# Patient Record
Sex: Female | Born: 1955 | Race: White | Hispanic: No | Marital: Married | Smoking: Never smoker
Health system: Southern US, Community
[De-identification: ages and names within clinical notes are randomized; demographics above are authoritative.]

## PROBLEM LIST (undated history)

## (undated) DIAGNOSIS — F514 Sleep terrors [night terrors]: Secondary | ICD-10-CM

## (undated) DIAGNOSIS — I10 Essential (primary) hypertension: Secondary | ICD-10-CM

## (undated) HISTORY — DX: Sleep terrors (night terrors): F51.4

## (undated) HISTORY — PX: HEMORRHOID SURGERY: SHX153

## (undated) HISTORY — PX: DILATION AND CURETTAGE OF UTERUS: SHX78

---

## 2018-09-15 DIAGNOSIS — U071 COVID-19: Secondary | ICD-10-CM | POA: Insufficient documentation

## 2018-09-15 HISTORY — DX: COVID-19: U07.1

## 2018-12-02 ENCOUNTER — Telehealth: Payer: Self-pay | Admitting: Emergency Medicine

## 2018-12-13 LAB — NOVEL CORONAVIRUS, NAA: SARS-CoV-2, NAA: NOT DETECTED

## 2019-11-18 LAB — COMPREHENSIVE METABOLIC PANEL
Albumin: 4.4 (ref 3.5–5.0)
Calcium: 9.5 (ref 8.7–10.7)

## 2019-11-18 LAB — BASIC METABOLIC PANEL
BUN: 16 (ref 4–21)
CO2: 28 — AB (ref 13–22)
Chloride: 101 (ref 99–108)
Creatinine: 0.8 (ref 0.5–1.1)
Glucose: 94
Potassium: 4.3 (ref 3.4–5.3)
Sodium: 140 (ref 137–147)

## 2019-11-18 LAB — HEPATIC FUNCTION PANEL
ALT: 10 (ref 7–35)
AST: 15 (ref 13–35)
Alkaline Phosphatase: 73 (ref 25–125)
Bilirubin, Total: 0.3

## 2019-11-18 LAB — TSH: TSH: 1.25 (ref 0.41–5.90)

## 2019-11-18 LAB — CBC AND DIFFERENTIAL
HCT: 38 (ref 36–46)
Hemoglobin: 12.9 (ref 12.0–16.0)
Platelets: 247 (ref 150–399)
WBC: 6.4

## 2019-11-18 LAB — LIPID PANEL
Cholesterol: 192 (ref 0–200)
HDL: 54 (ref 35–70)
LDL Cholesterol: 119
LDl/HDL Ratio: 2.2
Triglycerides: 104 (ref 40–160)

## 2019-12-26 ENCOUNTER — Ambulatory Visit (INDEPENDENT_AMBULATORY_CARE_PROVIDER_SITE_OTHER): Payer: Self-pay | Admitting: Gastroenterology

## 2019-12-26 ENCOUNTER — Other Ambulatory Visit: Payer: Self-pay

## 2019-12-26 DIAGNOSIS — Z1211 Encounter for screening for malignant neoplasm of colon: Secondary | ICD-10-CM

## 2019-12-26 MED ORDER — NA SULFATE-K SULFATE-MG SULF 17.5-3.13-1.6 GM/177ML PO SOLN: 1.0000 | Freq: Once | ORAL | 0 refills | Status: AC

## 2019-12-26 NOTE — Progress Notes (Signed)
Gastroenterology Pre-Procedure Review  Request Date: Friday 01/27/20 Requesting Physician: Dr. Tobi Bastos  PATIENT REVIEW QUESTIONS: The patient's daughter Karie Mainland responded to the following health history questions as indicated:    1. Are you having any GI issues? yes (stomach pain after eating) 2. Do you have a personal history of Polyps? no 3. Do you have a family history of Colon Cancer or Polyps? no 4. Diabetes Mellitus? no 5. Joint replacements in the past 12 months?no 6. Major health problems in the past 3 months?no 7. Any artificial heart valves, MVP, or defibrillator?no    MEDICATIONS & ALLERGIES:    Patient reports the following regarding taking any anticoagulation/antiplatelet therapy:   Plavix, Coumadin, Eliquis, Xarelto, Lovenox, Pradaxa, Brilinta, or Effient? no Aspirin? no  Patient confirms/reports the following medications:  No current outpatient medications on file.   No current facility-administered medications for this visit.    Patient confirms/reports the following allergies:  Not on File  No orders of the defined types were placed in this encounter.   AUTHORIZATION INFORMATION Primary Insurance: 1D#: Group #:  Secondary Insurance: 1D#: Group #:  SCHEDULE INFORMATION: Date: 01/27/20 Time: Location:[facility]

## 2019-12-27 ENCOUNTER — Encounter: Payer: Self-pay | Admitting: Gastroenterology

## 2019-12-28 ENCOUNTER — Other Ambulatory Visit (HOSPITAL_COMMUNITY): Payer: 59

## 2019-12-29 ENCOUNTER — Other Ambulatory Visit: Payer: Self-pay

## 2020-01-11 ENCOUNTER — Telehealth: Payer: Self-pay

## 2020-01-12 NOTE — Telephone Encounter (Signed)
PCP called 01/11/20 to speak to Dr. Tobi Bastos. Confirmed correct ph# & advised Dr. Tobi Bastos will call shortly.

## 2020-01-13 ENCOUNTER — Other Ambulatory Visit (HOSPITAL_COMMUNITY)
Admission: RE | Admit: 2020-01-13 | Discharge: 2020-01-13 | Disposition: A | Discharge status: Home / Self Care | Payer: 59 | Source: Ambulatory Visit | Attending: Gastroenterology | Admitting: Gastroenterology

## 2020-01-13 DIAGNOSIS — Z01812 Encounter for preprocedural laboratory examination: Secondary | ICD-10-CM | POA: Diagnosis present

## 2020-01-13 DIAGNOSIS — U071 COVID-19: Secondary | ICD-10-CM | POA: Insufficient documentation

## 2020-01-13 LAB — SARS CORONAVIRUS 2 (TAT 6-24 HRS): SARS Coronavirus 2: POSITIVE — AB

## 2020-01-14 NOTE — Progress Notes (Signed)
Merrily Pew from the on-call service of Dr. Wyline Mood with + covid results for this pt from Fri 4/30. The pt is scheduled for a procedure on Tues 5/4 at [facility].These are the current guidelines:  Positive Results for:  Asymptomatic: Procedure postponed and quarantine for 10 days. Symptomatic: Procedure postponed and quarantine for 14 days. Immunocompromised: Procedure postponed and quarantine for 20 days.  The pt will not be retested for 90 days from the + result.

## 2020-01-15 ENCOUNTER — Encounter: Payer: Self-pay | Admitting: Gastroenterology

## 2020-01-15 NOTE — Progress Notes (Signed)
Amber Nichols/Amber Nichols - inform patient she is positive for COVID- reschedule procedures.   FYI- Dr Dario Guardian    Dr Wyline Mood MD,MRCP [facility]) Gastroenterology/Hepatology Pager: [phone]

## 2020-01-16 ENCOUNTER — Other Ambulatory Visit: Payer: Self-pay

## 2020-01-16 DIAGNOSIS — R0789 Other chest pain: Secondary | ICD-10-CM

## 2020-01-16 DIAGNOSIS — Z1211 Encounter for screening for malignant neoplasm of colon: Secondary | ICD-10-CM

## 2020-01-17 ENCOUNTER — Ambulatory Visit: Admission: RE | Admit: 2020-01-17 | Payer: 59 | Source: Home / Self Care | Admitting: Gastroenterology

## 2020-01-17 ENCOUNTER — Encounter: Admission: RE | Payer: Self-pay | Source: Home / Self Care

## 2020-01-17 SURGERY — COLONOSCOPY WITH PROPOFOL
Anesthesia: General

## 2020-01-25 ENCOUNTER — Other Ambulatory Visit (HOSPITAL_COMMUNITY): Payer: 59

## 2020-02-08 ENCOUNTER — Other Ambulatory Visit: Admission: RE | Admit: 2020-02-08 | Payer: 59 | Source: Ambulatory Visit

## 2020-02-10 ENCOUNTER — Ambulatory Visit: Payer: 59 | Admitting: Anesthesiology

## 2020-02-10 ENCOUNTER — Ambulatory Visit
Admission: RE | Admit: 2020-02-10 | Discharge: 2020-02-10 | Disposition: A | Discharge status: Home / Self Care | Payer: 59 | Attending: Gastroenterology | Admitting: Gastroenterology

## 2020-02-10 ENCOUNTER — Encounter: Payer: Self-pay | Admitting: Gastroenterology

## 2020-02-10 ENCOUNTER — Encounter
Admission: RE | Disposition: A | Discharge status: Home / Self Care | Payer: Self-pay | Source: Home / Self Care | Attending: Gastroenterology

## 2020-02-10 DIAGNOSIS — Z79899 Other long term (current) drug therapy: Secondary | ICD-10-CM | POA: Diagnosis not present

## 2020-02-10 DIAGNOSIS — R1013 Epigastric pain: Secondary | ICD-10-CM

## 2020-02-10 DIAGNOSIS — R0789 Other chest pain: Secondary | ICD-10-CM

## 2020-02-10 DIAGNOSIS — K2950 Unspecified chronic gastritis without bleeding: Secondary | ICD-10-CM | POA: Insufficient documentation

## 2020-02-10 DIAGNOSIS — J45909 Unspecified asthma, uncomplicated: Secondary | ICD-10-CM | POA: Insufficient documentation

## 2020-02-10 DIAGNOSIS — I10 Essential (primary) hypertension: Secondary | ICD-10-CM | POA: Insufficient documentation

## 2020-02-10 DIAGNOSIS — K317 Polyp of stomach and duodenum: Secondary | ICD-10-CM | POA: Insufficient documentation

## 2020-02-10 DIAGNOSIS — Z1211 Encounter for screening for malignant neoplasm of colon: Secondary | ICD-10-CM

## 2020-02-10 HISTORY — DX: Essential (primary) hypertension: I10

## 2020-02-10 HISTORY — PX: ESOPHAGOGASTRODUODENOSCOPY (EGD) WITH PROPOFOL: SHX5813

## 2020-02-10 HISTORY — PX: COLONOSCOPY WITH PROPOFOL: SHX5780

## 2020-02-10 SURGERY — COLONOSCOPY WITH PROPOFOL
Anesthesia: General

## 2020-02-10 MED ORDER — PROPOFOL 500 MG/50ML IV EMUL
INTRAVENOUS | Status: DC | PRN
  Administered 2020-02-10: 40 mg via INTRAVENOUS
  Administered 2020-02-10: 80 ug/kg/min via INTRAVENOUS
  Administered 2020-02-10: 40 mg via INTRAVENOUS
  Administered 2020-02-10: 20 mg via INTRAVENOUS

## 2020-02-10 MED ORDER — SODIUM CHLORIDE 0.9 % IV SOLN
INTRAVENOUS | Status: DC
  Administered 2020-02-10: 1000 mL via INTRAVENOUS

## 2020-02-10 MED ORDER — PROPOFOL 500 MG/50ML IV EMUL
INTRAVENOUS | Status: AC
  Filled 2020-02-10: qty 50

## 2020-02-10 NOTE — Anesthesia Postprocedure Evaluation (Signed)
Anesthesia Post Note  Patient: Amber Nichols  Procedure(s) Performed: COLONOSCOPY WITH PROPOFOL (N/A ) ESOPHAGOGASTRODUODENOSCOPY (EGD) WITH PROPOFOL (N/A )  Patient location during evaluation: PACU Anesthesia Type: General Level of consciousness: awake and alert Pain management: pain level controlled Vital Signs Assessment: post-procedure vital signs reviewed and stable Respiratory status: spontaneous breathing, nonlabored ventilation, respiratory function stable and patient connected to nasal cannula oxygen Cardiovascular status: blood pressure returned to baseline and stable Postop Assessment: no apparent nausea or vomiting Anesthetic complications: no     Last Vitals:  Vitals:   02/10/20 1026 02/10/20 1107  BP: (!) 142/77 (!) 86/62  Pulse: 69 72  Resp: 18 14  Temp: 36.6 C 36.4 C  SpO2: 100% 100%    Last Pain:  Vitals:   02/10/20 1107  TempSrc: Temporal  PainSc: 0-No pain                 Yevette Edwards

## 2020-02-10 NOTE — Op Note (Signed)
[facility] Gastroenterology Patient Name: Amber Nichols Procedure Date: 02/10/2020 10:38 AM MRN: [id number] Account #: [id number] Date of Birth: 08-Jan-1956 Admit Type: Outpatient Age: 64 Room: [facility] ENDO ROOM 2 Gender: Female Note Status: Finalized Procedure:             Upper GI endoscopy Indications:           Dyspepsia Providers:             Wyline Mood MD, MD Referring MD:          Sherrie Mustache, MD (Referring MD) Medicines:             Monitored Anesthesia Care Complications:         No immediate complications. Procedure:             Pre-Anesthesia Assessment:                        - Prior to the procedure, a History and Physical was                         performed, and patient medications, allergies and                         sensitivities were reviewed. The patient's tolerance                         of previous anesthesia was reviewed.                        - The risks and benefits of the procedure and the                         sedation options and risks were discussed with the                         patient. All questions were answered and informed                         consent was obtained.                        - ASA Grade Assessment: II - A patient with mild                         systemic disease.                        After obtaining informed consent, the endoscope was                         passed under direct vision. Throughout the procedure,                         the patient's blood pressure, pulse, and oxygen                         saturations were monitored continuously. The Endoscope                         was introduced through the mouth, and advanced to  the                         third part of duodenum. The upper GI endoscopy was                         accomplished with ease. The patient tolerated the                         procedure well. Findings:      The esophagus was normal.      The examined duodenum was  normal.      Patchy mild inflammation characterized by congestion (edema) and       erythema was found in the entire examined stomach. Biopsies were taken       with a cold forceps for histology.      A single 6 mm sessile polyp with no bleeding and no stigmata of recent       bleeding was found in the stomach. The polyp was removed with a cold       snare. Resection and retrieval were complete.      The cardia and gastric fundus were normal on retroflexion. Impression:            - Normal esophagus.                        - Normal examined duodenum.                        - Gastritis. Biopsied.                        - A single gastric polyp. Resected and retrieved. Recommendation:        - Await pathology results.                        - Perform a colonoscopy today. Procedure Code(s):     --- Professional ---                        [phone], Esophagogastroduodenoscopy, flexible,                         transoral; with removal of tumor(s), polyp(s), or                         other lesion(s) by snare technique                        43239, 78, Esophagogastroduodenoscopy, flexible,                         transoral; with biopsy, single or multiple Diagnosis Code(s):     --- Professional ---                        K29.70, Gastritis, unspecified, without bleeding                        K31.7, Polyp of stomach and duodenum                        R10.13, Epigastric  pain CPT copyright 2019 American Medical Association. All rights reserved. The codes documented in this report are preliminary and upon coder review may  be revised to meet current compliance requirements. Wyline Mood, MD Wyline Mood MD, MD 02/10/2020 10:47:12 AM This report has been signed electronically. Number of Addenda: 0 Note Initiated On: 02/10/2020 10:38 AM Estimated Blood Loss:  Estimated blood loss: none.      [facility]

## 2020-02-10 NOTE — Transfer of Care (Signed)
Immediate Anesthesia Transfer of Care Note  Patient: Amber Nichols  Procedure(s) Performed: COLONOSCOPY WITH PROPOFOL (N/A ) ESOPHAGOGASTRODUODENOSCOPY (EGD) WITH PROPOFOL (N/A )  Patient Location: PACU  Anesthesia Type:General  Level of Consciousness: alert  and sedated  Airway & Oxygen Therapy: Patient Spontanous Breathing and Patient connected to nasal cannula oxygen  Post-op Assessment: Report given to RN and Post -op Vital signs reviewed and stable  Post vital signs: Reviewed and stable  Last Vitals:  Vitals Value Taken Time  BP 86/62 02/10/20 1107  Temp 36.4 C 02/10/20 1107  Pulse 72 02/10/20 1107  Resp 14 02/10/20 1107  SpO2 100 % 02/10/20 1107    Last Pain:  Vitals:   02/10/20 1107  TempSrc: Temporal  PainSc: 0-No pain         Complications: No apparent anesthesia complications

## 2020-02-10 NOTE — Progress Notes (Signed)
Patient speaks Amber Nichols, patient's daughter speaks [place].  An interpreter was utilized to discuss discharge instructions with the patient and to answer all questions.  Patient verbalized through the interpreter that she understands all of her discharge instructions as well as the results of the Colonoscopy and the Upper Endoscopy.  Per the patient request, I discussed the procedure results with her daughter as well as all of the discharge instructions.  The patient has written procedure reports and written discharge instructions for her daughter to review.  No questions at this time.  Patient has all belongings and her dentures in place.  Discharged to home.

## 2020-02-10 NOTE — Op Note (Signed)
[facility] Gastroenterology Patient Name: Amber Nichols Procedure Date: 02/10/2020 10:38 AM MRN: [id number] Account #: [id number] Date of Birth: 1955/12/24 Admit Type: Outpatient Age: 64 Room: [facility] ENDO ROOM 2 Gender: Female Note Status: Finalized Procedure:             Colonoscopy Indications:           Screening for colorectal malignant neoplasm Providers:             Jonathon Bellows MD, MD Referring MD:          Casilda Carls, MD (Referring MD) Medicines:             Monitored Anesthesia Care Complications:         No immediate complications. Procedure:             Pre-Anesthesia Assessment:                        - Prior to the procedure, a History and Physical was                         performed, and patient medications, allergies and                         sensitivities were reviewed. The patient's tolerance                         of previous anesthesia was reviewed.                        - The risks and benefits of the procedure and the                         sedation options and risks were discussed with the                         patient. All questions were answered and informed                         consent was obtained.                        - ASA Grade Assessment: II - A patient with mild                         systemic disease.                        After obtaining informed consent, the colonoscope was                         passed under direct vision. Throughout the procedure,                         the patient's blood pressure, pulse, and oxygen                         saturations were monitored continuously. The                         Colonoscope was introduced through the anus and  advanced to the the cecum, identified by the                         appendiceal orifice. The colonoscopy was performed                         with ease. The patient tolerated the procedure well.                         The  quality of the bowel preparation was excellent. Findings:      The perianal and digital rectal examinations were normal.      The entire examined colon appeared normal on direct and retroflexion       views. Impression:            - The entire examined colon is normal on direct and                         retroflexion views.                        - No specimens collected. Recommendation:        - Discharge patient to home (with escort).                        - Resume previous diet.                        - Continue present medications.                        - Repeat colonoscopy in 10 years for screening                         purposes. Procedure Code(s):     --- Professional ---                        [phone], Colonoscopy, flexible; diagnostic, including                         collection of specimen(s) by brushing or washing, when                         performed (separate procedure) Diagnosis Code(s):     --- Professional ---                        Z12.11, Encounter for screening for malignant neoplasm                         of colon CPT copyright 2019 American Medical Association. All rights reserved. The codes documented in this report are preliminary and upon coder review may  be revised to meet current compliance requirements. Wyline Mood, MD Wyline Mood MD, MD 02/10/2020 11:03:42 AM This report has been signed electronically. Number of Addenda: 0 Note Initiated On: 02/10/2020 10:38 AM Scope Withdrawal Time: 0 hours 7 minutes 0 seconds  Total Procedure Duration: 0 hours 8 minutes 55 seconds  Estimated Blood Loss:  Estimated blood loss: none.      [facility]

## 2020-02-10 NOTE — H&P (Signed)
Wyline Mood, MD [address], [place], [place], [address] [address], [address], [place], [place], [address] Phone: [phone]  Fax: [phone]  Primary Care Physician:  Sherrie Mustache, MD   Pre-Procedure History & Physical: HPI:  Amber Nichols is a 64 y.o. female is here for an endoscopy and colonoscopy    Past Medical History:  Diagnosis Date  . Hypertension     Past Surgical History:  Procedure Laterality Date  . DILATION AND CURETTAGE OF UTERUS    . HEMORRHOID SURGERY      Prior to Admission medications   Medication Sig Start Date End Date Taking? Authorizing Provider  albuterol (VENTOLIN HFA) 108 (90 Base) MCG/ACT inhaler Inhale into the lungs. 02/23/19 02/23/20  [provider]  bisoprolol (ZEBETA) 5 MG tablet Take by mouth.    [provider]  losartan (COZAAR) 25 MG tablet Take by mouth.    [provider]    Allergies as of 01/17/2020  . (No Known Allergies)    History reviewed. No pertinent family history.  Social History   Socioeconomic History  . Marital status: Unknown    Spouse name: Not on file  . Number of children: Not on file  . Years of education: Not on file  . Highest education level: Not on file  Occupational History  . Not on file  Tobacco Use  . Smoking status: Never Smoker  . Smokeless tobacco: Never Used  Substance and Sexual Activity  . Alcohol use: Never  . Drug use: Not on file  . Sexual activity: Not on file  Other Topics Concern  . Not on file  Social History Narrative  . Not on file   Social Determinants of Health   Financial Resource Strain:   . Difficulty of Paying Living Expenses:   Food Insecurity:   . Worried About Programme researcher, broadcasting/film/video in the Last Year:   . Barista in the Last Year:   Transportation Needs:   . Freight forwarder (Medical):   Marland Kitchen Lack of Transportation (Non-Medical):   Physical Activity:   . Days of Exercise per Week:   . Minutes of  Exercise per Session:   Stress:   . Feeling of Stress :   Social Connections:   . Frequency of Communication with Friends and Family:   . Frequency of Social Gatherings with Friends and Family:   . Attends Religious Services:   . Active Member of Clubs or Organizations:   . Attends Banker Meetings:   Marland Kitchen Marital Status:   Intimate Partner Violence:   . Fear of Current or Ex-Partner:   . Emotionally Abused:   Marland Kitchen Physically Abused:   . Sexually Abused:     Review of Systems: See HPI, otherwise negative ROS  Physical Exam: There were no vitals taken for this visit. General:   Alert,  pleasant and cooperative in NAD Head:  Normocephalic and atraumatic. Neck:  Supple; no masses or thyromegaly. Lungs:  Clear throughout to auscultation, normal respiratory effort.    Heart:  +S1, +S2, Regular rate and rhythm, No edema. Abdomen:  Soft, nontender and nondistended. Normal bowel sounds, without guarding, and without rebound.   Neurologic:  Alert and  oriented x4;  grossly normal neurologically.  Impression/Plan: Amber Nichols is here for an endoscopy and colonoscopy  to be performed for colon cancer screening and dyspepsia.    Risks, benefits, limitations, and alternatives regarding endoscopy have been reviewed with the  patient.  Questions have been answered.  All parties agreeable.   Jonathon Bellows, MD  02/10/2020, 10:11 AM

## 2020-02-10 NOTE — Anesthesia Preprocedure Evaluation (Addendum)
Anesthesia Evaluation  Patient identified by MRN, date of birth, ID band Patient awake    Reviewed: Allergy & Precautions, NPO status , Patient's Chart, lab work & pertinent test results  Airway Mallampati: III       Dental  (+) Upper Dentures, Lower Dentures   Pulmonary asthma ,    Pulmonary exam normal        Cardiovascular hypertension, Normal cardiovascular exam     Neuro/Psych negative neurological ROS  negative psych ROS   GI/Hepatic Neg liver ROS,   Endo/Other  negative endocrine ROS  Renal/GU negative Renal ROS  negative genitourinary   Musculoskeletal negative musculoskeletal ROS (+)   Abdominal Normal abdominal exam  (+)   Peds negative pediatric ROS (+)  Hematology negative hematology ROS (+)   Anesthesia Other Findings hemmorhoids and GI impaction.  Reproductive/Obstetrics                            Anesthesia Physical Anesthesia Plan  ASA: II  Anesthesia Plan: General   Post-op Pain Management:    Induction: Intravenous  PONV Risk Score and Plan: Propofol infusion  Airway Management Planned: Nasal Cannula  Additional Equipment:   Intra-op Plan:   Post-operative Plan:   Informed Consent: I have reviewed the patients History and Physical, chart, labs and discussed the procedure including the risks, benefits and alternatives for the proposed anesthesia with the patient or authorized representative who has indicated his/her understanding and acceptance.     Dental advisory given  Plan Discussed with: CRNA and Surgeon  Anesthesia Plan Comments:         Anesthesia Quick Evaluation

## 2020-02-14 LAB — SURGICAL PATHOLOGY

## 2020-03-07 ENCOUNTER — Telehealth: Payer: Self-pay

## 2020-03-07 NOTE — Telephone Encounter (Signed)
Called pt/ pt daughter to schedule pt's appointment with Dr. Robbie Lis to contact, LVM to return call

## 2020-03-07 NOTE — Telephone Encounter (Signed)
-----   Message from Wyline Mood, MD sent at 03/07/2020  8:10 AM EDT ----- Yes please arrange next week  ----- Message ----- From: Isa Rankin, CMA Sent: 03/06/2020   4:10 PM EDT To: Wyline Mood, MD  Dr. Tobi Bastos, pt's daughter wants to know if a telephone visit can be arranged instead? 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

## 2020-03-15 ENCOUNTER — Telehealth: Payer: Self-pay | Admitting: Gastroenterology

## 2020-03-15 NOTE — Telephone Encounter (Signed)
LVM for patient to call office to schedule Virtual appt. Per Dijah.

## 2020-03-20 ENCOUNTER — Other Ambulatory Visit: Payer: Self-pay | Admitting: Internal Medicine

## 2020-03-20 DIAGNOSIS — Z1231 Encounter for screening mammogram for malignant neoplasm of breast: Secondary | ICD-10-CM

## 2020-04-06 ENCOUNTER — Telehealth: Payer: 59 | Admitting: Gastroenterology

## 2020-05-09 ENCOUNTER — Telehealth: Payer: Self-pay | Admitting: Gastroenterology

## 2020-05-09 NOTE — Telephone Encounter (Signed)
Hey Dr.Gupta, patient is requesting to transfer care to you. Patients daughter has called asking to switch to you because the current GI is to far for her to take patient. She seen a GI in the past year. Records are in epic. Please review and advise if okay to schedule patient with you.

## 2020-05-09 NOTE — Telephone Encounter (Signed)
Patient scheduled for 10/29 with Dr. Chales Abrahams

## 2020-05-09 NOTE — Telephone Encounter (Signed)
No problems We will be happy to take care of her RG

## 2020-06-11 ENCOUNTER — Telehealth: Payer: Self-pay | Admitting: Internal Medicine

## 2020-06-11 NOTE — Telephone Encounter (Signed)
ok 

## 2020-06-11 NOTE — Telephone Encounter (Signed)
Ok to schedule.

## 2020-06-11 NOTE — Telephone Encounter (Signed)
Call back # [phone]  Patient states Dr. Tobb referred him to see Dr. Copland. According to patient Dr.Tobb already got it approved by Dr. Copland. 

## 2020-07-13 ENCOUNTER — Ambulatory Visit (INDEPENDENT_AMBULATORY_CARE_PROVIDER_SITE_OTHER): Payer: 59 | Admitting: Gastroenterology

## 2020-07-13 ENCOUNTER — Encounter: Payer: Self-pay | Admitting: Gastroenterology

## 2020-07-13 VITALS — BP 100/70 | HR 72 | Ht 64.0 in | Wt 165.4 lb

## 2020-07-13 DIAGNOSIS — K581 Irritable bowel syndrome with constipation: Secondary | ICD-10-CM

## 2020-07-13 DIAGNOSIS — R109 Unspecified abdominal pain: Secondary | ICD-10-CM | POA: Diagnosis not present

## 2020-07-13 NOTE — Patient Instructions (Signed)
If you are age 63 or older, your body mass index should be between 23-30. Your Body mass index is 28.39 kg/m. If this is out of the aforementioned range listed, please consider follow up with your Primary Care Provider.  If you are age 80 or younger, your body mass index should be between 19-25. Your Body mass index is 28.39 kg/m. If this is out of the aformentioned range listed, please consider follow up with your Primary Care Provider.   Please purchase the following medications over the counter and take as directed:  Benefiber daily.    You are scheduled on 07/17/2020 at 8:00am at [place]. You should arrive 15 minutes prior to your appointment time for registration. Please follow the written instructions below on the day of your exam:  WARNING: IF YOU ARE ALLERGIC TO IODINE/X-RAY DYE, PLEASE NOTIFY RADIOLOGY IMMEDIATELY AT [phone]! YOU WILL BE GIVEN A 13 HOUR PREMEDICATION PREP.  1) Do not eat or drink anything after 4:00am (4 hours prior to your test) 2) You have been given 2 bottles of oral contrast to drink. The solution may taste better if refrigerated, but do NOT add ice or any other liquid to this solution. Shake well before drinking.    Drink 1 bottle of contrast @ 6:00am (2 hours prior to your exam)  Drink 1 bottle of contrast @ 7:00am (1 hour prior to your exam)  You may take any medications as prescribed with a small amount of water, if necessary. If you take any of the following medications: METFORMIN, GLUCOPHAGE, GLUCOVANCE, AVANDAMET, RIOMET, FORTAMET, [place] MET, JANUMET, GLUMETZA or METAGLIP, you MAY be asked to HOLD this medication 48 hours AFTER the exam.  The purpose of you drinking the oral contrast is to aid in the visualization of your intestinal tract. The contrast solution may cause some diarrhea. Depending on your individual set of symptoms, you may also receive an intravenous injection of x-ray contrast/dye. Plan on being at [facility] for 30 minutes or longer, depending on the type of exam you are having performed.  This test typically takes 30-45 minutes to complete.  Please contact [employer] to see if they are able to help you with fixing your dentures. [phone]. If you are unable to get the help you need please contact me back and I will look further for a charity that may help. [phone]  Due to recent changes in healthcare laws, you may see the results of your imaging and laboratory studies on MyChart before your provider has had a chance to review them.  We understand that in some cases there may be results that are confusing or concerning to you. Not all laboratory results come back in the same time frame and the provider may be waiting for multiple results in order to interpret others.  Please give [place] 48 hours in order for your provider to thoroughly review all the results before contacting the office for clarification of your results.

## 2020-07-13 NOTE — Progress Notes (Signed)
Chief Complaint:   Referring Provider:  Sherrie Mustache, MD      ASSESSMENT AND PLAN;   #1. Chr constipation. Neg colon 01/2020. H/O hemorrhoidectomy in [place] complicated by minor rectal stenosis.  #2. Periumbilical Abdo pain with tenderness and vague fulness.  No definite masses. Neg EGD 02/15/2020  Plan: -Any charity care to get dentures fixed (Pl see if social worket) -Benefiber 1TBS po qd with 8 ounces of water.  Start ASAP. -CT AP with PO and IV contrast. -Check CBC, CMP and lipase before CT. -Continue high-fiber diet.  Chew food well and eat slowly.  I have gone in detail regarding EGD and colonoscopy reports and pictures. -Follow-up if still with problems. -Discussed in detail with the patient and patient's daughter.   HPI:    Amber Nichols is a 64 y.o. female  Who speaks Farsi.  Accompanied by her daughter who acted as interpreter  For continued GI problems.  Initially she has been constantly burping with associated abdominal pain.  She underwent EGD/colonoscopy on 02/10/2020 which was unremarkable.  Denies having any significant nausea, vomiting, heartburn, regurgitation, odynophagia or dysphagia.  Her main problem is that of dentures which has been broken.  These are very expensive to get fixed.  As long as she chews her food well, she feels much better.  Lately has been complaining of sharp intermittent abdominal pain mainly in the periumbilical area with some abdominal bloating.  She would occasionally get constipated and would manually perform rectal stimulation to get the stool out.  Mostly once per week.  Has pellet-like stools.  Does drink plenty of water.  Denies having any melena or hematochezia.  No significant weight loss.  Had hoidectomy in [place] previously and had problems ever since.  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   Past GI procedures: Colonoscopy 02/10/2020 - The entire examined colon is normal on direct and retroflexion views. - No  specimens collected. - Repeat in 10 years.  EGD 02/10/2020 - Normal esophagus. - Normal examined duodenum. - Gastritis. Biopsied. Neg HP Bx - A single gastric polyp. Resected and retrieved. Bx-hyperplastic  Past Medical History:  Diagnosis Date  . COVID-19 virus infection 2020  . Hypertension     Past Surgical History:  Procedure Laterality Date  . COLONOSCOPY WITH PROPOFOL N/A 02/10/2020   Procedure: COLONOSCOPY WITH PROPOFOL;  Surgeon: Wyline Mood, MD;  Location: [facility] ENDOSCOPY;  Service: Gastroenterology;  Laterality: N/A;  C-19 POSITIVE ON 01/13/20  . DILATION AND CURETTAGE OF UTERUS    . ESOPHAGOGASTRODUODENOSCOPY (EGD) WITH PROPOFOL N/A 02/10/2020   Procedure: ESOPHAGOGASTRODUODENOSCOPY (EGD) WITH PROPOFOL;  Surgeon: Wyline Mood, MD;  Location: [facility] ENDOSCOPY;  Service: Gastroenterology;  Laterality: N/A;  . HEMORRHOID SURGERY      Family History  Problem Relation Age of Onset  . Colon cancer Neg Hx   . Esophageal cancer Neg Hx     Social History   Tobacco Use  . Smoking status: Never Smoker  . Smokeless tobacco: Never Used  Vaping Use  . Vaping Use: Never used  Substance Use Topics  . Alcohol use: Never  . Drug use: Never    Current Outpatient Medications  Medication Sig Dispense Refill  . CALCIUM PO Take by mouth as needed.    Marland Kitchen losartan (COZAAR) 25 MG tablet Take 25 mg by mouth daily.     Marland Kitchen VITAMIN D PO Take by mouth as needed.     No current facility-administered medications for this visit.  No Known Allergies  Review of Systems:  Constitutional: Denies fever, chills, diaphoresis, appetite change and fatigue.  HEENT: Denies photophobia, eye pain, redness, hearing loss, ear pain, congestion, sore throat, rhinorrhea, sneezing, mouth sores, neck pain, neck stiffness and tinnitus.   Respiratory: Denies SOB, DOE, cough, chest tightness,  and wheezing.   Cardiovascular: Denies chest pain, palpitations and leg swelling.  Genitourinary: Denies dysuria,  urgency, frequency, hematuria, flank pain and difficulty urinating.  Musculoskeletal: Denies myalgias, back pain, joint swelling, arthralgias and gait problem.  Skin: No rash.  Neurological: Denies dizziness, seizures, syncope, weakness, light-headedness, numbness and headaches.  Hematological: Denies adenopathy. Easy bruising, personal or family bleeding history  Psychiatric/Behavioral: No anxiety or depression     Physical Exam:    BP 100/70   Pulse 72   Ht 5\' 4"  (1.626 m)   Wt 165 lb 6 oz (75 kg)   BMI 28.39 kg/m  Wt Readings from Last 3 Encounters:  07/13/20 165 lb 6 oz (75 kg)  02/10/20 165 lb 5.5 oz (75 kg)   Constitutional:  Well-developed, in no acute distress. Psychiatric: Normal mood and affect. Behavior is normal. HEENT: Pupils normal.  Conjunctivae are normal. No scleral icterus. Neck supple.  Cardiovascular: Normal rate, regular rhythm. No edema Pulmonary/chest: Effort normal and breath sounds normal. No wheezing, rales or rhonchi. Abdominal: Soft, nondistended.  There is some tenderness generalized but more prominently around the umbilical area.  No hernias.  Vague fullness.  No definite masses.  Bowel sounds active throughout. There are no masses palpable. No hepatomegaly. Rectal:  defered Neurological: Alert and oriented to person place and time. Skin: Skin is warm and dry. No rashes noted.    02/12/20, MD 07/13/2020, 10:40 AM  Cc: 07/15/2020, MD

## 2020-07-16 ENCOUNTER — Other Ambulatory Visit: Payer: Self-pay

## 2020-07-16 ENCOUNTER — Telehealth: Payer: Self-pay | Admitting: Gastroenterology

## 2020-07-16 DIAGNOSIS — K581 Irritable bowel syndrome with constipation: Secondary | ICD-10-CM

## 2020-07-16 DIAGNOSIS — R109 Unspecified abdominal pain: Secondary | ICD-10-CM

## 2020-07-16 NOTE — Telephone Encounter (Signed)
Pt's daughter is requesting a call back from a nurse to discuss pt's dentures, pt states [employer] [facility] was not able to help her.

## 2020-07-16 NOTE — Progress Notes (Signed)
Left voicemail for patient to get lab work done before her CT scan.

## 2020-07-17 ENCOUNTER — Ambulatory Visit (HOSPITAL_BASED_OUTPATIENT_CLINIC_OR_DEPARTMENT_OTHER): Payer: 59

## 2020-07-17 NOTE — Telephone Encounter (Signed)
Patient's daughter has been informed that she can try [facility] to discuss repairing her dentures the number provide [phone]

## 2020-08-01 ENCOUNTER — Other Ambulatory Visit: Payer: Self-pay

## 2020-08-01 DIAGNOSIS — R109 Unspecified abdominal pain: Secondary | ICD-10-CM

## 2020-08-25 NOTE — Progress Notes (Addendum)
[facility] [address] [place], [place] [address] [phone]  Date:  08/27/2020   Name:  Amber Nichols   DOB:  20-Feb-1956   MRN:  [id number]  PCP:  Amber Cables, MD    Chief Complaint: New Patient (Initial Visit), Joint Pain (Lower back pain, bilateral knee pain, weakness in body, trouble sleeping, burning sensation in left breast/chest area, negative xray chest), and Other (Bitter Taste in mouth in morning/)   History of Present Illness:  Amber Nichols is a 64 y.o. very pleasant female patient who presents with the following:  Patient here today to establish care- her daughter is a pt of mine and helps with interpretation today.  Amber Nichols is from [place], she is accompanied today by her husband and daughter.  She is in the process of learning English. She has multiple complaints today She has concern of longstanding bilateral knee pain. The right knee is the worst. Sometimes it will seem to get stuck and she has difficulty walking-it sounds as though she may be having a mechanical block from a loose body.  She also has lower back pain  She also has complaint of feeling fatigued and will sometimes awaken with a feeling that "my soul is coming out of my body, I have no energy-" it seems like eating or drinking something sweet seems to help her when this occurs.  She also has complaint of a strong craving for sweets early in the morning  She did have a colonoscopy in May of this year per their report No recent labs on chart She is not fasting this am   She was seeing another family doctor- Dr Amber Nichols in [place] earlier.  They would like to change to my practice as it is much closer for them.  Apparently Amber Nichols evaluated her concern of long term chest pain- present for a few years-with what sounds like an x-ray and also stress test. They report that this evaluation was normal, I have requested  records She also did see a cardiologist in [place] at some point in the past mammo scheduled for later this week Most recent pap was 3-4 years ago, never had an abnormal; we will plan to do this at her next visit They decline flu shot today - encouraged her to get this done  They are thinking of getting a COVID-19 booster, I encouraged this is soon as possible.  Encouraged them to stop at pharmacy downstairs today and have this done  There are no problems to display for this patient.   Past Medical History:  Diagnosis Date  . COVID-19 virus infection 2020  . Hypertension   . Night terror     Past Surgical History:  Procedure Laterality Date  . COLONOSCOPY WITH PROPOFOL N/A 02/10/2020   Procedure: COLONOSCOPY WITH PROPOFOL;  Surgeon: Amber Mood, MD;  Location: [facility] ENDOSCOPY;  Service: Gastroenterology;  Laterality: N/A;  C-19 POSITIVE ON 01/13/20  . DILATION AND CURETTAGE OF UTERUS    . ESOPHAGOGASTRODUODENOSCOPY (EGD) WITH PROPOFOL N/A 02/10/2020   Procedure: ESOPHAGOGASTRODUODENOSCOPY (EGD) WITH PROPOFOL;  Surgeon: Amber Mood, MD;  Location: [facility] ENDOSCOPY;  Service: Gastroenterology;  Laterality: N/A;  . HEMORRHOID SURGERY      Social History   Tobacco Use  . Smoking status: Never Smoker  . Smokeless tobacco: Never Used  Vaping Use  . Vaping Use: Never used  Substance Use Topics  . Alcohol use: Never  . Drug use:  Never    Family History  Problem Relation Age of Onset  . Colon cancer Neg Hx   . Esophageal cancer Neg Hx     No Known Allergies  Medication list has been reviewed and updated.  Current Outpatient Medications on File Prior to Visit  Medication Sig Dispense Refill  . CALCIUM PO Take by mouth as needed.    Marland Kitchen losartan (COZAAR) 25 MG tablet Take 12.5 mg by mouth daily.    Marland Kitchen VITAMIN D PO Take by mouth as needed.     No current facility-administered medications on file prior to visit.    Review of Systems:  As per HPI- otherwise negative.   Physical  Examination: Vitals:   08/27/20 1008  BP: 110/76  Pulse: 78  Resp: 15  SpO2: 97%   Vitals:   08/27/20 1008  Weight: 164 lb (74.4 kg)  Height: 5' 3.75" (1.619 m)   Body mass index is 28.37 kg/m. Ideal Body Weight: Weight in (lb) to have BMI = 25: 144.2  GEN: no acute distress.  Mild overweight, otherwise looks well HEENT: Atraumatic, Normocephalic.   Bilateral TM wnl, oropharynx normal.  PEERL,EOMI.   Ears and Nose: No external deformity. CV: RRR, No M/G/R. No JVD. No thrill. No extra heart sounds. PULM: CTA B, no wheezes, crackles, rhonchi. No retractions. No resp. distress. No accessory muscle use. ABD: S, NT, ND, +BS. No rebound. No HSM. EXTR: No c/c/e PSYCH: Normally interactive. Conversant.  She has crepitus and obvious pain with manipulation of her right knee, mild limp   Assessment and Plan: Essential hypertension - Plan: CBC, Comprehensive metabolic panel  Fatigue, unspecified type - Plan: TSH, VITAMIN D 25 Hydroxy (Vit-D Deficiency, Fractures)  Chronic pain of right knee  Immunization due  Screening for diabetes mellitus - Plan: Comprehensive metabolic panel, Hemoglobin A1c  Screening for hyperlipidemia - Plan: Lipid panel  Encounter for hepatitis C screening test for low risk patient - Plan: Hepatitis C antibody  Patient today to establish care and discuss a few other concerns Blood pressure is well controlled on current dose of losartan.  Labs are pending as above I am not certain what to make of her feeling that "her soul is leaving her body"-it sounds as though she may be having hypoglycemia.  We will check an A1c for today Encouraged her to have COVID-19 booster and also flu vaccine Ordered orthopedics consultation for her knee pain, encouraged her to try Voltaren gel topically.  She has tried Tylenol which is not generally helpful, has also used ibuprofen 800 which does help but can irritate her stomach  We will request stress test records from her  previous PCP  Her gastroenterologist, Dr. Chales Nichols has recommended a CT abdomen pelvis for concern of abdominal pains.  I encouraged her to get this done as soon as possible  This visit occurred during the SARS-CoV-2 public health emergency.  Safety protocols were in place, including screening questions prior to the visit, additional usage of staff PPE, and extensive cleaning of exam room while observing appropriate contact time as indicated for disinfecting solutions.    Signed Abbe Amsterdam, MD  Received her labs as below- message to pt  Results for orders placed or performed in visit on 08/27/20  CBC  Result Value Ref Range   WBC 5.4 4.0 - 10.5 K/uL   RBC 4.21 3.87 - 5.11 Mil/uL   Platelets 247.0 150.0 - 400.0 K/uL   Hemoglobin 12.7 12.0 - 15.0 g/dL   HCT 38.3  36.0 - 46.0 %   MCV 91.0 78.0 - 100.0 fl   MCHC 33.1 30.0 - 36.0 g/dL   RDW 03.5 59.7 - 41.6 %  Comprehensive metabolic panel  Result Value Ref Range   Sodium 140 135 - 145 mEq/L   Potassium 4.5 3.5 - 5.1 mEq/L   Chloride 102 96 - 112 mEq/L   CO2 32 19 - 32 mEq/L   Glucose, Bld 88 70 - 99 mg/dL   BUN 19 6 - 23 mg/dL   Creatinine, Ser 3.84 0.40 - 1.20 mg/dL   Total Bilirubin 0.4 0.2 - 1.2 mg/dL   Alkaline Phosphatase 61 39 - 117 U/L   AST 15 0 - 37 U/L   ALT 13 0 - 35 U/L   Total Protein 7.3 6.0 - 8.3 g/dL   Albumin 4.3 3.5 - 5.2 g/dL   GFR 53.64 >68.03 mL/min   Calcium 9.5 8.4 - 10.5 mg/dL  Hemoglobin O1Y  Result Value Ref Range   Hgb A1c MFr Bld 5.8 4.6 - 6.5 %  Lipid panel  Result Value Ref Range   Cholesterol 205 (H) 0 - 200 mg/dL   Triglycerides 24.8 0.0 - 149.0 mg/dL   HDL 25.00 >37.04 mg/dL   VLDL 88.8 0.0 - 91.6 mg/dL   LDL Cholesterol 945 (H) 0 - 99 mg/dL   Total CHOL/HDL Ratio 4    NonHDL 149.73   TSH  Result Value Ref Range   TSH 0.90 0.35 - 4.50 uIU/mL  VITAMIN D 25 Hydroxy (Vit-D Deficiency, Fractures)  Result Value Ref Range   VITD 30.91 30.00 - 100.00 ng/mL

## 2020-08-27 ENCOUNTER — Ambulatory Visit (INDEPENDENT_AMBULATORY_CARE_PROVIDER_SITE_OTHER): Payer: 59 | Admitting: Family Medicine

## 2020-08-27 ENCOUNTER — Other Ambulatory Visit: Payer: Self-pay

## 2020-08-27 ENCOUNTER — Encounter: Payer: Self-pay | Admitting: Family Medicine

## 2020-08-27 VITALS — BP 110/76 | HR 78 | Resp 15 | Ht 63.75 in | Wt 164.0 lb

## 2020-08-27 DIAGNOSIS — M25561 Pain in right knee: Secondary | ICD-10-CM

## 2020-08-27 DIAGNOSIS — R5383 Other fatigue: Secondary | ICD-10-CM | POA: Diagnosis not present

## 2020-08-27 DIAGNOSIS — G8929 Other chronic pain: Secondary | ICD-10-CM

## 2020-08-27 DIAGNOSIS — Z131 Encounter for screening for diabetes mellitus: Secondary | ICD-10-CM

## 2020-08-27 DIAGNOSIS — Z23 Encounter for immunization: Secondary | ICD-10-CM

## 2020-08-27 DIAGNOSIS — Z1159 Encounter for screening for other viral diseases: Secondary | ICD-10-CM

## 2020-08-27 DIAGNOSIS — I10 Essential (primary) hypertension: Secondary | ICD-10-CM | POA: Diagnosis not present

## 2020-08-27 DIAGNOSIS — Z13220 Encounter for screening for lipoid disorders: Secondary | ICD-10-CM

## 2020-08-27 LAB — LIPID PANEL
Cholesterol: 205 mg/dL — ABNORMAL HIGH (ref 0–200)
HDL: 55.5 mg/dL (ref >39.00)
LDL Cholesterol: 131 mg/dL — ABNORMAL HIGH (ref 0–99)
NonHDL: 149.73
Total CHOL/HDL Ratio: 4
Triglycerides: 92 mg/dL (ref 0.0–149.0)
VLDL: 18.4 mg/dL (ref 0.0–40.0)

## 2020-08-27 LAB — CBC
HCT: 38.3 % (ref 36.0–46.0)
Hemoglobin: 12.7 g/dL (ref 12.0–15.0)
MCHC: 33.1 g/dL (ref 30.0–36.0)
MCV: 91 fl (ref 78.0–100.0)
Platelets: 247 10*3/uL (ref 150.0–400.0)
RBC: 4.21 Mil/uL (ref 3.87–5.11)
RDW: 13.3 % (ref 11.5–15.5)
WBC: 5.4 10*3/uL (ref 4.0–10.5)

## 2020-08-27 LAB — COMPREHENSIVE METABOLIC PANEL
ALT: 13 U/L (ref 0–35)
AST: 15 U/L (ref 0–37)
Albumin: 4.3 g/dL (ref 3.5–5.2)
Alkaline Phosphatase: 61 U/L (ref 39–117)
BUN: 19 mg/dL (ref 6–23)
CO2: 32 mEq/L (ref 19–32)
Calcium: 9.5 mg/dL (ref 8.4–10.5)
Chloride: 102 mEq/L (ref 96–112)
Creatinine, Ser: 0.75 mg/dL (ref 0.40–1.20)
GFR: 84.85 mL/min (ref >60.00)
Glucose, Bld: 88 mg/dL (ref 70–99)
Potassium: 4.5 mEq/L (ref 3.5–5.1)
Sodium: 140 mEq/L (ref 135–145)
Total Bilirubin: 0.4 mg/dL (ref 0.2–1.2)
Total Protein: 7.3 g/dL (ref 6.0–8.3)

## 2020-08-27 LAB — TSH: TSH: 0.9 u[IU]/mL (ref 0.35–4.50)

## 2020-08-27 LAB — VITAMIN D 25 HYDROXY (VIT D DEFICIENCY, FRACTURES): VITD: 30.91 ng/mL (ref 30.00–100.00)

## 2020-08-27 LAB — HEMOGLOBIN A1C: Hgb A1c MFr Bld: 5.8 % (ref 4.6–6.5)

## 2020-08-27 NOTE — Patient Instructions (Addendum)
It was very nice to meet you today, I will be in touch with your labs asap  Please get your covid 19 booster asap, hopefully today!  I also recommend a flu shot  We will set you up for an orthopedic consult for your knee   Try Voltaren gel for your knee pain- this may be helpful for you I will request your past records from Dr Dario Guardian  Please schedule your CT of your abdomen and pelvis for Dr Chales Abrahams

## 2020-08-28 LAB — HEPATITIS C ANTIBODY
Hepatitis C Ab: NONREACTIVE
SIGNAL TO CUT-OFF: 0.02 (ref <1.00)

## 2020-08-30 ENCOUNTER — Ambulatory Visit (INDEPENDENT_AMBULATORY_CARE_PROVIDER_SITE_OTHER): Payer: 59 | Admitting: Orthopedic Surgery

## 2020-08-30 ENCOUNTER — Ambulatory Visit (INDEPENDENT_AMBULATORY_CARE_PROVIDER_SITE_OTHER): Payer: 59

## 2020-08-30 ENCOUNTER — Encounter: Payer: Self-pay | Admitting: Orthopedic Surgery

## 2020-08-30 VITALS — Ht 63.0 in | Wt 164.0 lb

## 2020-08-30 DIAGNOSIS — M545 Low back pain, unspecified: Secondary | ICD-10-CM

## 2020-08-30 DIAGNOSIS — M25561 Pain in right knee: Secondary | ICD-10-CM | POA: Diagnosis not present

## 2020-08-30 DIAGNOSIS — G8929 Other chronic pain: Secondary | ICD-10-CM

## 2020-08-30 MED ORDER — PREDNISONE 10 MG PO TABS: 10.0000 mg | ORAL_TABLET | Freq: Every day | ORAL | 1 refills | Status: DC

## 2020-08-30 NOTE — Progress Notes (Signed)
Office Visit Note   Patient: Amber Nichols           Date of Birth: 1956/07/18           MRN: [id number] Visit Date: 08/30/2020              Requested by: Pearline Cables, MD [address] [place],  [place] [address] PCP: Pearline Cables, MD  Chief Complaint  Patient presents with  . Right Knee - Pain  . Left Knee - Pain      HPI: Patient is a 64 year old woman who presents complaining of chronic right knee pain as well as chronic lower back pain.  She states the back pain radiates down the posterior lateral aspect of the right thigh to the popliteal fossa.  She states the knee pain is medial she states she does have mechanical symptoms with catching and locking of the right knee.  Patient's daughter states that she has had several MRI scans in [place] was diagnosed with stenosis and had epidural steroid injections x2.  Patient complains of lower back pain when sitting for a long period of time.  Assessment & Plan: Visit Diagnoses:  1. Lumbar pain   2. Chronic pain of right knee     Plan: Offered an injection for the right knee.  Patient states she would like to hold on injection she would like to try the prednisone 10 mg with breakfast reevaluate in 4 weeks.  Follow-Up Instructions: Return in about 4 weeks (around 09/27/2020).   Ortho Exam  Patient is alert, oriented, no adenopathy, well-dressed, normal affect, normal respiratory effort. Examination patient has a normal gait she has a negative straight leg raise on the right.  There is no crepitation with range of motion of the right knee she is point tender to palpation of the medial joint line.  There is no popping or locking with range of motion.  She has a negative straight leg raise on the right hip flexion is symmetric bilaterally she has good plantar flexion dorsiflexion strength.  Imaging: XR Knee 1-2 Views Right  Result Date: 08/30/2020 2 view radiographs of the right knee shows some mild  medial spurring symmetric with both knees no joint space narrowing.  XR Lumbar Spine 2-3 Views  Result Date: 08/30/2020 2 view radiographs of the lumbar spine shows maintenance of the disc space there is some mild spurring anteriorly no spondylolisthesis.  No disc space collapse.  No images are attached to the encounter.  Labs: Lab Results  Component Value Date   HGBA1C 5.8 08/27/2020     Lab Results  Component Value Date   ALBUMIN 4.3 08/27/2020    No results found for: MG Lab Results  Component Value Date   VD25OH 30.91 08/27/2020    No results found for: PREALBUMIN CBC EXTENDED Latest Ref Rng & Units 08/27/2020  WBC 4.0 - 10.5 K/uL 5.4  RBC 3.87 - 5.11 Mil/uL 4.21  HGB 12.0 - 15.0 g/dL 50.0  HCT 37.0 - 48.8 % 38.3  PLT 150.0 - 400.0 K/uL 247.0     Body mass index is 29.05 kg/m.  Orders:  Orders Placed This Encounter  Procedures  . XR Knee 1-2 Views Right  . XR Lumbar Spine 2-3 Views   Meds ordered this encounter  Medications  . predniSONE (DELTASONE) 10 MG tablet    Sig: Take 1 tablet (10 mg total) by mouth daily with breakfast.    Dispense:  30 tablet  Refill:  1     Procedures: No procedures performed  Clinical Data: No additional findings.  ROS:  All other systems negative, except as noted in the HPI. Review of Systems  Objective: Vital Signs: Ht 5\' 3"  (1.6 m)   Wt 164 lb (74.4 kg)   BMI 29.05 kg/m   Specialty Comments:  No specialty comments available.  PMFS History: There are no problems to display for this patient.  Past Medical History:  Diagnosis Date  . COVID-19 virus infection 2020  . Hypertension   . Night terror     Family History  Problem Relation Age of Onset  . Colon cancer Neg Hx   . Esophageal cancer Neg Hx     Past Surgical History:  Procedure Laterality Date  . COLONOSCOPY WITH PROPOFOL N/A 02/10/2020   Procedure: COLONOSCOPY WITH PROPOFOL;  Surgeon: 02/12/2020, MD;  Location: [facility] ENDOSCOPY;  Service:  Gastroenterology;  Laterality: N/A;  C-19 POSITIVE ON 01/13/20  . DILATION AND CURETTAGE OF UTERUS    . ESOPHAGOGASTRODUODENOSCOPY (EGD) WITH PROPOFOL N/A 02/10/2020   Procedure: ESOPHAGOGASTRODUODENOSCOPY (EGD) WITH PROPOFOL;  Surgeon: 02/12/2020, MD;  Location: [facility] ENDOSCOPY;  Service: Gastroenterology;  Laterality: N/A;  . HEMORRHOID SURGERY     Social History   Occupational History  . Not on file  Tobacco Use  . Smoking status: Never Smoker  . Smokeless tobacco: Never Used  Vaping Use  . Vaping Use: Never used  Substance and Sexual Activity  . Alcohol use: Never  . Drug use: Never  . Sexual activity: Not on file

## 2020-08-31 ENCOUNTER — Other Ambulatory Visit: Payer: Self-pay

## 2020-08-31 ENCOUNTER — Ambulatory Visit
Admission: RE | Admit: 2020-08-31 | Discharge: 2020-08-31 | Disposition: A | Discharge status: Home / Self Care | Payer: 59 | Source: Ambulatory Visit | Attending: Internal Medicine | Admitting: Internal Medicine

## 2020-08-31 DIAGNOSIS — Z1231 Encounter for screening mammogram for malignant neoplasm of breast: Secondary | ICD-10-CM | POA: Diagnosis not present

## 2020-08-31 LAB — HM MAMMOGRAPHY

## 2020-09-03 ENCOUNTER — Other Ambulatory Visit: Payer: Self-pay

## 2020-09-03 ENCOUNTER — Ambulatory Visit (HOSPITAL_BASED_OUTPATIENT_CLINIC_OR_DEPARTMENT_OTHER)
Admission: RE | Admit: 2020-09-03 | Discharge: 2020-09-03 | Disposition: A | Discharge status: Home / Self Care | Payer: 59 | Source: Ambulatory Visit | Attending: Gastroenterology | Admitting: Gastroenterology

## 2020-09-03 DIAGNOSIS — K581 Irritable bowel syndrome with constipation: Secondary | ICD-10-CM | POA: Insufficient documentation

## 2020-09-03 DIAGNOSIS — R109 Unspecified abdominal pain: Secondary | ICD-10-CM | POA: Diagnosis present

## 2020-09-03 MED ORDER — IOHEXOL 300 MG/ML  SOLN
100.0000 mL | Freq: Once | INTRAMUSCULAR | Status: AC | PRN
  Administered 2020-09-03: 100 mL via INTRAVENOUS

## 2020-09-06 ENCOUNTER — Other Ambulatory Visit: Payer: Self-pay | Admitting: Internal Medicine

## 2020-09-06 ENCOUNTER — Encounter: Payer: Self-pay | Admitting: Family Medicine

## 2020-09-06 DIAGNOSIS — R928 Other abnormal and inconclusive findings on diagnostic imaging of breast: Secondary | ICD-10-CM

## 2020-09-06 DIAGNOSIS — R921 Mammographic calcification found on diagnostic imaging of breast: Secondary | ICD-10-CM

## 2020-09-10 ENCOUNTER — Encounter: Payer: Self-pay | Admitting: Family Medicine

## 2020-09-10 DIAGNOSIS — R079 Chest pain, unspecified: Secondary | ICD-10-CM

## 2020-09-11 ENCOUNTER — Ambulatory Visit: Payer: 59 | Attending: Internal Medicine

## 2020-09-11 ENCOUNTER — Other Ambulatory Visit: Payer: Self-pay

## 2020-09-11 ENCOUNTER — Other Ambulatory Visit (HOSPITAL_BASED_OUTPATIENT_CLINIC_OR_DEPARTMENT_OTHER): Payer: Self-pay | Admitting: Internal Medicine

## 2020-09-11 ENCOUNTER — Ambulatory Visit
Admission: RE | Admit: 2020-09-11 | Discharge: 2020-09-11 | Disposition: A | Discharge status: Home / Self Care | Payer: 59 | Source: Ambulatory Visit | Attending: Internal Medicine | Admitting: Internal Medicine

## 2020-09-11 DIAGNOSIS — R928 Other abnormal and inconclusive findings on diagnostic imaging of breast: Secondary | ICD-10-CM | POA: Diagnosis not present

## 2020-09-11 DIAGNOSIS — R921 Mammographic calcification found on diagnostic imaging of breast: Secondary | ICD-10-CM

## 2020-09-11 DIAGNOSIS — Z23 Encounter for immunization: Secondary | ICD-10-CM

## 2020-09-11 MED FILL — MODERNA COVID-19 VACCINE 10: 100 | 28 days supply | Qty: 0 | Fill #0

## 2020-09-11 NOTE — Progress Notes (Signed)
   Covid-19 Vaccination Clinic  Name:  Amber Nichols    MRN: [id number] DOB: 08-22-1956  09/11/2020  [name] was observed post Covid-19 immunization for 15 minutes without incident. She was provided with Vaccine Information Sheet and instruction to access the V-Safe system.  Vaccinated by Fredirick Maudlin  [name] was instructed to call 911 with any severe reactions post vaccine: Marland Kitchen Difficulty breathing  . Swelling of face and throat  . A fast heartbeat  . A bad rash all over body  . Dizziness and weakness   Immunizations Administered    Name Date Dose VIS Date Route   Moderna Covid-19 Booster Vaccine 09/11/2020 10:36 AM 0.25 mL 07/04/2020 Intramuscular   Manufacturer: Gala Murdoch   Lot: [id number]   NDC: 16837-290-21

## 2020-09-13 ENCOUNTER — Telehealth: Payer: Self-pay

## 2020-09-13 NOTE — Telephone Encounter (Signed)
DR JADALI CALLED INTO OFFICE TO INFORM us THAT HE IS NOT GOING TO FOLLOW THIS PATIENT'S MAMMOGRAMS, SHE IS NO LONGER HIS PATIENT. HE TOLD ME WE NEEDED TO ASK THE PATIENT WHO THEIR DR IS WHEN WE SCHEDULE THEM, NOT JUST ASSUME,  I INFORMED HIM, WE DO ASK, EACH TIME, AND  HIS NAME WAS PROVIDED BY THE PATIENT'S DAUGHTER AT THE TIME OF SCHEDULING. I ALSO INFORMED HIM THAT HE SIGNED HER AV ORDER WHEN IT WAS FAXED TO HIM. HE STATED HE WAS BEING NICE BUT HE WAS NOT GOING TO CONTINUE TO SIGN HER MAMMOGRAM ORDERS. WE WILL NEED TO OBTAIN NEW ORDERS FOR PATIENTS RECOMMENDED FOLLOW UP MAMMOGRAM.

## 2020-09-24 ENCOUNTER — Encounter: Payer: Self-pay | Admitting: Orthopedic Surgery

## 2020-09-24 ENCOUNTER — Ambulatory Visit (INDEPENDENT_AMBULATORY_CARE_PROVIDER_SITE_OTHER): Payer: 59 | Admitting: Orthopedic Surgery

## 2020-09-24 ENCOUNTER — Ambulatory Visit (HOSPITAL_BASED_OUTPATIENT_CLINIC_OR_DEPARTMENT_OTHER)
Admission: RE | Admit: 2020-09-24 | Discharge: 2020-09-24 | Disposition: A | Discharge status: Home / Self Care | Payer: 59 | Source: Ambulatory Visit | Attending: Family Medicine | Admitting: Family Medicine

## 2020-09-24 ENCOUNTER — Other Ambulatory Visit: Payer: Self-pay

## 2020-09-24 ENCOUNTER — Encounter: Payer: Self-pay | Admitting: Family Medicine

## 2020-09-24 ENCOUNTER — Ambulatory Visit (INDEPENDENT_AMBULATORY_CARE_PROVIDER_SITE_OTHER): Payer: 59 | Admitting: Family Medicine

## 2020-09-24 VITALS — BP 110/78 | HR 78 | Resp 17 | Ht 63.0 in | Wt 166.0 lb

## 2020-09-24 DIAGNOSIS — R0602 Shortness of breath: Secondary | ICD-10-CM | POA: Diagnosis present

## 2020-09-24 DIAGNOSIS — M545 Low back pain, unspecified: Secondary | ICD-10-CM

## 2020-09-24 DIAGNOSIS — F419 Anxiety disorder, unspecified: Secondary | ICD-10-CM | POA: Diagnosis not present

## 2020-09-24 DIAGNOSIS — F32A Depression, unspecified: Secondary | ICD-10-CM

## 2020-09-24 DIAGNOSIS — R079 Chest pain, unspecified: Secondary | ICD-10-CM | POA: Diagnosis not present

## 2020-09-24 DIAGNOSIS — G8929 Other chronic pain: Secondary | ICD-10-CM

## 2020-09-24 MED ORDER — FLUOXETINE HCL 20 MG PO TABS: 20.0000 mg | ORAL_TABLET | Freq: Every day | ORAL | 6 refills | Status: DC

## 2020-09-24 NOTE — Progress Notes (Signed)
Office Visit Note   Patient: Amber Nichols           Date of Birth: 08-16-1956           MRN: [id number] Visit Date: 09/24/2020              Requested by: Pearline Cables, MD [address] [place],  [place] [address] PCP: Pearline Cables, MD  Chief Complaint  Patient presents with  . Lower Back - Follow-up  . Right Knee - Follow-up      HPI: Patient is a 65 year old woman who presents in follow-up for lower back pain with radicular symptoms down both lower extremities pain radiating to the right calf and radiating into the left hip.  Patient states that she did have a vaccine and since the vaccine and using the prednisone her blood pressure has been variable and up to 150 systolic in the afternoon.  Patient states she feels much better with the prednisone she now can perform activities of daily living and can go up and down stairs without problems.  She is currently taking 5 mg a day.  Assessment & Plan: Visit Diagnoses:  1. Lumbar pain     Plan: Recommended that she wean off and resume using if she has exacerbation of her symptoms recommended exercising strengthening and heat for her back.  Follow-Up Instructions: Return if symptoms worsen or fail to improve.   Ortho Exam  Patient is alert, oriented, no adenopathy, well-dressed, normal affect, normal respiratory effort. Examination patient has a negative straight leg raise bilaterally no focal motor weakness in either lower extremity.  Imaging: No results found. No images are attached to the encounter.  Labs: Lab Results  Component Value Date   HGBA1C 5.8 08/27/2020     Lab Results  Component Value Date   ALBUMIN 4.3 08/27/2020   ALBUMIN 4.4 11/18/2019    No results found for: MG Lab Results  Component Value Date   VD25OH 30.91 08/27/2020    No results found for: PREALBUMIN CBC EXTENDED Latest Ref Rng & Units 08/27/2020 11/18/2019  WBC 4.0 - 10.5 K/uL 5.4 6.4  RBC 3.87 - 5.11  Mil/uL 4.21 -  HGB 12.0 - 15.0 g/dL 79.3 90.3  HCT 00.9 - 23.3 % 38.3 38  PLT 150.0 - 400.0 K/uL 247.0 247     There is no height or weight on file to calculate BMI.  Orders:  No orders of the defined types were placed in this encounter.  No orders of the defined types were placed in this encounter.    Procedures: No procedures performed  Clinical Data: No additional findings.  ROS:  All other systems negative, except as noted in the HPI. Review of Systems  Objective: Vital Signs: There were no vitals taken for this visit.  Specialty Comments:  No specialty comments available.  PMFS History: There are no problems to display for this patient.  Past Medical History:  Diagnosis Date  . COVID-19 virus infection 2020  . Hypertension   . Night terror     Family History  Problem Relation Age of Onset  . Breast cancer Sister   . Colon cancer Neg Hx   . Esophageal cancer Neg Hx     Past Surgical History:  Procedure Laterality Date  . COLONOSCOPY WITH PROPOFOL N/A 02/10/2020   Procedure: COLONOSCOPY WITH PROPOFOL;  Surgeon: Wyline Mood, MD;  Location: [facility] ENDOSCOPY;  Service: Gastroenterology;  Laterality: N/A;  C-19 POSITIVE ON 01/13/20  .  DILATION AND CURETTAGE OF UTERUS    . ESOPHAGOGASTRODUODENOSCOPY (EGD) WITH PROPOFOL N/A 02/10/2020   Procedure: ESOPHAGOGASTRODUODENOSCOPY (EGD) WITH PROPOFOL;  Surgeon: Wyline Mood, MD;  Location: [facility] ENDOSCOPY;  Service: Gastroenterology;  Laterality: N/A;  . HEMORRHOID SURGERY     Social History   Occupational History  . Not on file  Tobacco Use  . Smoking status: Never Smoker  . Smokeless tobacco: Never Used  Vaping Use  . Vaping Use: Never used  Substance and Sexual Activity  . Alcohol use: Never  . Drug use: Never  . Sexual activity: Not on file

## 2020-09-24 NOTE — Patient Instructions (Signed)
It was good to see you again today Please go to lab and then to x-ray on the ground floor for your chest x-ray  Please start on fluoxetine 10 mg daily for about one week, then increase to 20 mg daily- we will see if this controls your anxiety!  We can go up to 40 mg in a month or so if needed  Your BP looks good- I would recommend NOT checking your BP so often at home as it seems to cause anxiety

## 2020-09-24 NOTE — Progress Notes (Addendum)
Amber Nichols at [facility] [address], [address] [place], [place] [address] [phone]  Date:  09/24/2020   Name:  Amber Nichols   DOB:  [birth year]   MRN:  [id number]  PCP:  Pearline Cables, MD    Chief Complaint: Blood pressure Concerns (Blood pressure fluctuating, more elevated, noticed after booster vaccine), lab work (Discuss CO2 level is it related to numbness in body/), and Depression (Advice on calming her down to help relax/)   History of Present Illness:  Amber Nichols is a 65 y.o. very pleasant female patient who presents with the following:  Visit today with concern of elevated BP  I saw this patient most recently to establish care on December 13 At that time she had many different concerns, her blood pressure was well controlled on losartan 12.5 mg daily  BP Readings from Last 3 Encounters:  09/24/20 110/78  08/27/20 110/76  07/13/20 100/70   She had her covid booster shot   They feel that since she had her covid booster 12/28 They feel that the same thing happened with her first and 2nd dose   She seems to be checking her BP at lot- she will feel "like my head is burning, I know there is something wrong with my body" and she will check her BP and it is "high or low"  She was treated for depression/ anxiety in the past - she was on some mediation in the past but they cannot remember quite what it was  We think that perhaps she took fluoxetine in the past and this seemed to work well for her - her daughter remembers that she was "better while she was taking it than she has been in a long time"   They note that she will wake up during the night "screaming, some part of her body will feel numb," and she has also had complaint of a feeling that her soul is leaving her body   Never a smoker  She has complaint of chest pain for 5-6 years She has an appt with cardiology- Dr Servando Salina- coming up next week   There are no  problems to display for this patient.   Past Medical History:  Diagnosis Date  . COVID-19 virus infection 2020  . Hypertension   . Night terror     Past Surgical History:  Procedure Laterality Date  . COLONOSCOPY WITH PROPOFOL N/A 02/10/2020   Procedure: COLONOSCOPY WITH PROPOFOL;  Surgeon: Wyline Mood, MD;  Location: [facility] ENDOSCOPY;  Service: Gastroenterology;  Laterality: N/A;  C-19 POSITIVE ON 01/13/20  . DILATION AND CURETTAGE OF UTERUS    . ESOPHAGOGASTRODUODENOSCOPY (EGD) WITH PROPOFOL N/A 02/10/2020   Procedure: ESOPHAGOGASTRODUODENOSCOPY (EGD) WITH PROPOFOL;  Surgeon: Wyline Mood, MD;  Location: [facility] ENDOSCOPY;  Service: Gastroenterology;  Laterality: N/A;  . HEMORRHOID SURGERY      Social History   Tobacco Use  . Smoking status: Never Smoker  . Smokeless tobacco: Never Used  Vaping Use  . Vaping Use: Never used  Substance Use Topics  . Alcohol use: Never  . Drug use: Never    Family History  Problem Relation Age of Onset  . Breast cancer Sister   . Colon cancer Neg Hx   . Esophageal cancer Neg Hx     No Known Allergies  Medication list has been reviewed and updated.  Current Outpatient Medications on File Prior to Visit  Medication Sig Dispense Refill  . losartan (COZAAR)  25 MG tablet Take 12.5 mg by mouth daily.    Marland Kitchen CALCIUM PO Take by mouth as needed.    Marland Kitchen VITAMIN D PO Take by mouth as needed.     No current facility-administered medications on file prior to visit.    Review of Systems:  As per HPI- otherwise negative.   Physical Examination: Vitals:   09/24/20 1508  BP: 110/78  Pulse: 78  Resp: 17  SpO2: 98%   Vitals:   09/24/20 1508  Weight: 166 lb (75.3 kg)  Height: 5\' 3"  (1.6 m)   Body mass index is 29.41 kg/m. Ideal Body Weight: Weight in (lb) to have BMI = 25: 140.8  GEN: no acute distress. Overweight, otherwise looks well  HEENT: Atraumatic, Normocephalic.  Ears and Nose: No external deformity. CV: RRR, No M/G/R. No JVD. No  thrill. No extra heart sounds. PULM: CTA B, no wheezes, crackles, rhonchi. No retractions. No resp. distress. No accessory muscle use. ABD: S, NT, ND, +BS. No rebound. No HSM. EXTR: No c/c/e PSYCH: Normally interactive. Conversant.    Assessment and Plan: Anxiety and depression - Plan: FLUoxetine (PROZAC) 20 MG tablet  SOB (shortness of breath) - Plan: DG Chest 2 View, D-Dimer, Quantitative, Basic metabolic panel, CBC  Chronic chest pain  Pt here today for a follow-up visit Many of her symptoms seem related to anxiety and depression Will have her start on fluoxetine- pt has 10 mg on hand, they will use this for one week and then increase to 20 mg She has an appt to see Dr next week to discuss her chronic chest pain  She also has complaint of chronic SOB Will check a CXR and also obtain D dimer They understand that if positive we will have to do a CT angiogram  Please call daughter with D dimer results  Signed Servando Salina, MD  Received CXR as below: DG Chest 2 View  Result Date: 09/24/2020 CLINICAL DATA:  Chronic shortness of breath. EXAM: CHEST - 2 VIEW COMPARISON:  None. FINDINGS: The heart size and mediastinal contours are within normal limits. No focal consolidation. No pleural effusion or pneumothorax. The visualized skeletal structures are unremarkable. IMPRESSION: No active cardiopulmonary disease. Electronically Signed   By: 11/22/2020 MD   On: 09/24/2020 16:44    addnd 1/11- received D dimer, it is negative  Results for orders placed or performed in visit on 09/24/20  D-Dimer, Quantitative  Result Value Ref Range   D-Dimer, Quant 0.30 <0.50 mcg/mL FEU  received her other labs, called her daughter 11/22/20 and [facility]  Results for orders placed or performed in visit on 09/24/20  D-Dimer, Quantitative  Result Value Ref Range   D-Dimer, Quant 0.30 <0.50 mcg/mL FEU  Basic metabolic panel  Result Value Ref Range   Sodium 139 135 - 145 mEq/L   Potassium 4.4  3.5 - 5.1 mEq/L   Chloride 102 96 - 112 mEq/L   CO2 30 19 - 32 mEq/L   Glucose, Bld 102 (H) 70 - 99 mg/dL   BUN 16 6 - 23 mg/dL   Creatinine, Ser 11/22/20 0.40 - 1.20 mg/dL   GFR 7.79 39.03 mL/min   Calcium 9.5 8.4 - 10.5 mg/dL  CBC  Result Value Ref Range   WBC 8.7 4.0 - 10.5 K/uL   RBC 4.24 3.87 - 5.11 Mil/uL   Platelets 302.0 150.0 - 400.0 K/uL   Hemoglobin 12.9 12.0 - 15.0 g/dL   HCT >00.92 33.0 - 07.6 %  MCV 90.9 78.0 - 100.0 fl   MCHC 33.5 30.0 - 36.0 g/dL   RDW 16.1 09.6 - 04.5 %

## 2020-09-25 ENCOUNTER — Encounter: Payer: Self-pay | Admitting: Family Medicine

## 2020-09-25 LAB — CBC
HCT: 38.6 % (ref 36.0–46.0)
Hemoglobin: 12.9 g/dL (ref 12.0–15.0)
MCHC: 33.5 g/dL (ref 30.0–36.0)
MCV: 90.9 fl (ref 78.0–100.0)
Platelets: 302 10*3/uL (ref 150.0–400.0)
RBC: 4.24 Mil/uL (ref 3.87–5.11)
RDW: 13.4 % (ref 11.5–15.5)
WBC: 8.7 10*3/uL (ref 4.0–10.5)

## 2020-09-25 LAB — BASIC METABOLIC PANEL
BUN: 16 mg/dL (ref 6–23)
CO2: 30 mEq/L (ref 19–32)
Calcium: 9.5 mg/dL (ref 8.4–10.5)
Chloride: 102 mEq/L (ref 96–112)
Creatinine, Ser: 0.94 mg/dL (ref 0.40–1.20)
GFR: 64.67 mL/min (ref >60.00)
Glucose, Bld: 102 mg/dL — ABNORMAL HIGH (ref 70–99)
Potassium: 4.4 mEq/L (ref 3.5–5.1)
Sodium: 139 mEq/L (ref 135–145)

## 2020-09-25 LAB — D-DIMER, QUANTITATIVE: D-Dimer, Quant: 0.3 mcg/mL FEU (ref <0.50)

## 2020-10-01 DIAGNOSIS — I10 Essential (primary) hypertension: Secondary | ICD-10-CM | POA: Insufficient documentation

## 2020-10-01 DIAGNOSIS — F514 Sleep terrors [night terrors]: Secondary | ICD-10-CM | POA: Insufficient documentation

## 2020-10-02 ENCOUNTER — Encounter: Payer: Self-pay | Admitting: Family Medicine

## 2020-10-02 ENCOUNTER — Telehealth: Payer: Self-pay

## 2020-10-02 NOTE — Telephone Encounter (Signed)
PA initiated via Covermymeds; KEY:  [id number]. Awaiting determination.

## 2020-10-03 ENCOUNTER — Ambulatory Visit: Payer: Self-pay | Admitting: Cardiology

## 2020-10-03 ENCOUNTER — Ambulatory Visit (INDEPENDENT_AMBULATORY_CARE_PROVIDER_SITE_OTHER): Payer: 59 | Admitting: Cardiology

## 2020-10-03 ENCOUNTER — Encounter: Payer: Self-pay | Admitting: Cardiology

## 2020-10-03 ENCOUNTER — Other Ambulatory Visit: Payer: Self-pay

## 2020-10-03 ENCOUNTER — Other Ambulatory Visit: Payer: Self-pay | Admitting: *Deleted

## 2020-10-03 VITALS — BP 110/72 | HR 86 | Ht 64.0 in | Wt 165.0 lb

## 2020-10-03 DIAGNOSIS — R0600 Dyspnea, unspecified: Secondary | ICD-10-CM

## 2020-10-03 DIAGNOSIS — I200 Unstable angina: Secondary | ICD-10-CM | POA: Diagnosis not present

## 2020-10-03 DIAGNOSIS — R072 Precordial pain: Secondary | ICD-10-CM

## 2020-10-03 DIAGNOSIS — R0609 Other forms of dyspnea: Secondary | ICD-10-CM

## 2020-10-03 DIAGNOSIS — I10 Essential (primary) hypertension: Secondary | ICD-10-CM | POA: Diagnosis not present

## 2020-10-03 DIAGNOSIS — E782 Mixed hyperlipidemia: Secondary | ICD-10-CM

## 2020-10-03 MED ORDER — ROSUVASTATIN CALCIUM 5 MG PO TABS: 5.0000 mg | ORAL_TABLET | Freq: Every day | ORAL | 3 refills | Status: DC

## 2020-10-03 MED ORDER — METOPROLOL TARTRATE 100 MG PO TABS: ORAL_TABLET | ORAL | 0 refills | Status: DC

## 2020-10-03 NOTE — Patient Instructions (Addendum)
Medication Instructions:  Your physician has recommended you make the following change in your medication:  1.  STOP Bisoprolol-HCTZ 2.  START Crestor 5 mg taking 1 daily  *If you need a refill on your cardiac medications before your next appointment, please call your pharmacy*   Lab Work: At the time of your Cardiac CT:  BMET (will let you know when to come)  If you have labs (blood work) drawn today and your tests are completely normal, you will receive your results only by: Marland Kitchen MyChart Message (if you have MyChart) OR . A paper copy in the mail If you have any lab test that is abnormal or we need to change your treatment, we will call you to review the results.   Testing/Procedures: Your physician has requested that you have cardiac CT. Cardiac computed tomography (CT) is a painless test that uses an x-ray machine to take clear, detailed pictures of your heart. For further information please visit [email or web address]. Please follow instruction sheet  BELOW:    Your cardiac CT will be scheduled at  [facility] [place], [place] [address] [phone]  Please arrive at the [facility] main entrance of [facility] 30 minutes prior to test start time. Proceed to the [facility] Radiology Department (first floor) to check-in and test prep.  If scheduled at [facility], please arrive 15 mins early for check-in and test prep.  Please follow these instructions carefully (unless otherwise directed):  On the Night Before the Test: . Be sure to Drink plenty of water. . Do not consume any caffeinated/decaffeinated beverages or chocolate 12 hours prior to your test. . Do not take any antihistamines 12 hours prior to your test.   On the Day of the Test: . Drink plenty of water. Do not drink any water within one hour of the test. . Do not eat any food 4 hours prior to the test. . You may take your regular medications prior to  the test.  . Take metoprolol (Lopressor) 100 mg two hours prior to test.(this has been sent to [facility]) . FEMALES- please wear underwire-free bra if available     After the Test: . Drink plenty of water. . After receiving IV contrast, you may experience a mild flushed feeling. This is normal. . On occasion, you may experience a mild rash up to 24 hours after the test. This is not dangerous. If this occurs, you can take Benadryl 25 mg and increase your fluid intake. . If you experience trouble breathing, this can be serious. If it is severe call 911 IMMEDIATELY. If it is mild, please call our office. . If you take any of these medications: Glipizide/Metformin, Avandament, Glucavance, please do not take 48 hours after completing test unless otherwise instructed.   Once we have confirmed authorization from your insurance company, we will call you to set up a date and time for your test. Based on how quickly your insurance processes prior authorizations requests, please allow up to 4 weeks to be contacted for scheduling your Cardiac CT appointment. Be advised that routine Cardiac CT appointments could be scheduled as many as 8 weeks after your provider has ordered it.  For non-scheduling related questions, please contact the cardiac imaging nurse navigator should you have any questions/concerns: Marchia Bond, Cardiac Imaging Nurse Navigator Burley Saver, Interim Cardiac Imaging Nurse [place] [facility] Services Direct Office Dial: [phone]   For scheduling needs, including cancellations and  rescheduling, please call [place], [phone].     Follow-Up: At [facility], you and your health needs are our priority.  As part of our continuing mission to provide you with exceptional heart care, we have created designated Provider Care Teams.  These Care Teams include your primary Cardiologist (physician) and Advanced Practice Providers (APPs -  Physician Assistants and  Nurse Practitioners) who all work together to provide you with the care you need, when you need it.  We recommend signing up for the patient portal called "MyChart".  Sign up information is provided on this After Visit Summary.  MyChart is used to connect with patients for Virtual Visits (Telemedicine).  Patients are able to view lab/test results, encounter notes, upcoming appointments, etc.  Non-urgent messages can be sent to your provider as well.   To learn more about what you can do with MyChart, go to [email or web address].    Your next appointment:   6 month(s)  The format for your next appointment:   In Person  Provider:   Berniece Salines, DO   Other Instructions

## 2020-10-03 NOTE — Progress Notes (Signed)
Cardiology Office Note:    Date:  10/03/2020   ID:  Amber Nichols, DOB 28-Sep-1955, MRN [id number]  PCP:  Darreld Mclean, MD  Cardiologist:  Berniece Salines, DO  Electrophysiologist:  None   Referring MD: Darreld Mclean, MD   " I am have intermittent chest pain"  History of Present Illness:    Amber Nichols is a 65 y.o. female with a hx of  Hypertension, anxiety and depression is here today to be evaluated for chest pain and shortness of breath.   The patient comes today to be evaluated for chest pain and intermittent shortness of breath as she tells me she is here with her daughter she is declining official interpretation and prefers to her daughter.  Interpret for her at this time.  She tells me she has a left-sided chest tightness sensation which comes and goes has been going on for years.  She had a work-up done by her previous physician at home is in [facility] and were going to request records.  However the patient and her daughter brings with her an echocardiogram which was done in a running in February 2020.Marland Kitchen  She tells me that she has had echocardiograms with her previous physician pending records for review.  She has had some issues with anxiety and ACS.    As a young girl she has had multiple experiences that have left her with anxiety.  Her daughter says she is finding any way here in the [place] where she is more, but she does have triggers.   Past Medical History:  Diagnosis Date  . COVID-19 virus infection 2020  . Hypertension   . Night terror     Past Surgical History:  Procedure Laterality Date  . COLONOSCOPY WITH PROPOFOL N/A 02/10/2020   Procedure: COLONOSCOPY WITH PROPOFOL;  Surgeon: Jonathon Bellows, MD;  Location: [facility] ENDOSCOPY;  Service: Gastroenterology;  Laterality: N/A;  C-19 POSITIVE ON 01/13/20  . DILATION AND CURETTAGE OF UTERUS    . ESOPHAGOGASTRODUODENOSCOPY (EGD) WITH PROPOFOL N/A 02/10/2020   Procedure:  ESOPHAGOGASTRODUODENOSCOPY (EGD) WITH PROPOFOL;  Surgeon: Jonathon Bellows, MD;  Location: [facility] ENDOSCOPY;  Service: Gastroenterology;  Laterality: N/A;  . HEMORRHOID SURGERY      Current Medications: Current Meds  Medication Sig  . ALPRAZolam (XANAX) 0.5 MG tablet Take 0.5 mg by mouth at bedtime as needed for anxiety. ONLY TAKES HALF THE PILL  . CALCIUM PO Take by mouth as needed.  Marland Kitchen FLUoxetine (PROZAC) 20 MG tablet Take 1 tablet (20 mg total) by mouth daily.  Marland Kitchen losartan (COZAAR) 25 MG tablet Take 12.5 mg by mouth daily.  . metoprolol tartrate (LOPRESSOR) 100 MG tablet TAKE 1 TABLET BY MOUTH 2 HOURS PRIOR TO YOUR CARDIAC CT  . predniSONE (DELTASONE) 10 MG tablet Take 10 mg by mouth daily with breakfast.  . rosuvastatin (CRESTOR) 5 MG tablet Take 1 tablet (5 mg total) by mouth daily.  Marland Kitchen VITAMIN D PO Take by mouth as needed.  . [DISCONTINUED] bisoprolol-hydrochlorothiazide (ZIAC) 2.5-6.25 MG tablet Take 1 tablet by mouth daily. 1/4 of the tablet daily     Allergies:   Patient has no known allergies.   Social History   Socioeconomic History  . Marital status: Married    Spouse name: Not on file  . Number of children: Not on file  . Years of education: Not on file  . Highest education level: Not on file  Occupational History  . Not on file  Tobacco Use  .  Smoking status: Never Smoker  . Smokeless tobacco: Never Used  Vaping Use  . Vaping Use: Never used  Substance and Sexual Activity  . Alcohol use: Never  . Drug use: Never  . Sexual activity: Not on file  Other Topics Concern  . Not on file  Social History Narrative  . Not on file   Social Determinants of Health   Financial Resource Strain: Not on file  Food Insecurity: Not on file  Transportation Needs: Not on file  Physical Activity: Not on file  Stress: Not on file  Social Connections: Not on file     Family History: The patient's family history includes Breast cancer in her sister. There is no history of Colon  cancer or Esophageal cancer.  ROS:   Review of Systems  Constitution: Negative for decreased appetite, fever and weight gain.  HENT: Negative for congestion, ear discharge, hoarse voice and sore throat.   Eyes: Negative for discharge, redness, vision loss in right eye and visual halos.  Cardiovascular: Negative for chest pain, dyspnea on exertion, leg swelling, orthopnea and palpitations.  Respiratory: Negative for cough, hemoptysis, shortness of breath and snoring.   Endocrine: Negative for heat intolerance and polyphagia.  Hematologic/Lymphatic: Negative for bleeding problem. Does not bruise/bleed easily.  Skin: Negative for flushing, nail changes, rash and suspicious lesions.  Musculoskeletal: Negative for arthritis, joint pain, muscle cramps, myalgias, neck pain and stiffness.  Gastrointestinal: Negative for abdominal pain, bowel incontinence, diarrhea and excessive appetite.  Genitourinary: Negative for decreased libido, genital sores and incomplete emptying.  Neurological: Negative for brief paralysis, focal weakness, headaches and loss of balance.  Psychiatric/Behavioral: Negative for altered mental status, depression and suicidal ideas.  Allergic/Immunologic: Negative for HIV exposure and persistent infections.    EKGs/Labs/Other Studies Reviewed:    The following studies were reviewed today:   EKG:  The ekg ordered today demonstrates Sinus rhythm, HR 86 bpm,   Recent Labs: 08/27/2020: ALT 13; TSH 0.90 09/24/2020: BUN 16; Creatinine, Ser 0.94; Hemoglobin 12.9; Platelets 302.0; Potassium 4.4; Sodium 139  Recent Lipid Panel    Component Value Date/Time   CHOL 205 (H) 08/27/2020 1053   TRIG 92.0 08/27/2020 1053   HDL 55.50 08/27/2020 1053   CHOLHDL 4 08/27/2020 1053   VLDL 18.4 08/27/2020 1053   LDLCALC 131 (H) 08/27/2020 1053    Physical Exam:    VS:  BP 110/72 (BP Location: Right Arm)   Pulse 86   Ht 5' 4"  (1.626 m)   Wt 165 lb (74.8 kg)   SpO2 98%   BMI 28.32  kg/m     Wt Readings from Last 3 Encounters:  10/03/20 165 lb (74.8 kg)  09/24/20 166 lb (75.3 kg)  08/30/20 164 lb (74.4 kg)     GEN: Well nourished, well developed in no acute distress HEENT: Normal NECK: No JVD; No carotid bruits LYMPHATICS: No lymphadenopathy CARDIAC: S1S2 noted,RRR, no murmurs, rubs, gallops RESPIRATORY:  Clear to auscultation without rales, wheezing or rhonchi  ABDOMEN: Soft, non-tender, non-distended, +bowel sounds, no guarding. EXTREMITIES: No edema, No cyanosis, no clubbing MUSCULOSKELETAL:  No deformity  SKIN: Warm and dry NEUROLOGIC:  Alert and oriented x 3, non-focal PSYCHIATRIC:  Normal affect, good insight  ASSESSMENT:    1. Primary hypertension   2. Precordial chest pain   3. Dyspnea on exertion   4. Unstable angina pectoris ([place])   5. Mixed hyperlipidemia    PLAN:     1. I reviewed the record that is present with her  echo showing normal ejection fraction.  She report a recent repeat therefore I will wait for the records before repeating.   2. Her blood pressure is acceptable at this time.  We will like to stop the bisoprolol she has been ordered 2.5 mg but she takes 1/4 tablet of this medication.  We will continue to monitor her on losartan 12.5 mg.  The patient and her daughter are both concerned in fact that she does have hypotension monitor her antihypertensive medications increased.  We will be very cautious  3. her chest pain is concerning patient does have intermediate risk for coronary artery disease would not like to do is pursue an ischemic evaluation in this patient.  Shared decision a coronary CTA at this time is appropriate.  I have discussed with the patient about the testing.  The patient has no IV contrast allergy and is agreeable to proceed with this test.  4.  Her recent lipid profile showed elevated LDL and total cholesterol she is agreeable to start statin started Crestor 10 mg daily.   The patient is in agreement with the  above plan. The patient left the office in stable condition.  The patient will follow up in 6 [place]   Medication Adjustments/Labs and Tests Ordered: Current medicines are reviewed at length with the patient today.  Concerns regarding medicines are outlined above.  Orders Placed This Encounter  Procedures  . CT CORONARY MORPH W/CTA COR W/SCORE W/CA W/CM &/OR WO/CM  . CT CORONARY FRACTIONAL FLOW RESERVE DATA PREP  . CT CORONARY FRACTIONAL FLOW RESERVE FLUID ANALYSIS  . Basic metabolic panel  . EKG 12-Lead   Meds ordered this encounter  Medications  . rosuvastatin (CRESTOR) 5 MG tablet    Sig: Take 1 tablet (5 mg total) by mouth daily.    Dispense:  90 tablet    Refill:  3  . metoprolol tartrate (LOPRESSOR) 100 MG tablet    Sig: TAKE 1 TABLET BY MOUTH 2 HOURS PRIOR TO YOUR CARDIAC CT    Dispense:  1 tablet    Refill:  0    Patient Instructions  Medication Instructions:  Your physician has recommended you make the following change in your medication:  1.  STOP Bisoprolol-HCTZ 2.  START Crestor 5 mg taking 1 daily  *If you need a refill on your cardiac medications before your next appointment, please call your pharmacy*   Lab Work: At the time of your Cardiac CT:  BMET (will let you know when to come)  If you have labs (blood work) drawn today and your tests are completely normal, you will receive your results only by: Marland Kitchen MyChart Message (if you have MyChart) OR . A paper copy in the mail If you have any lab test that is abnormal or we need to change your treatment, we will call you to review the results.   Testing/Procedures: Your physician has requested that you have cardiac CT. Cardiac computed tomography (CT) is a painless test that uses an x-ray machine to take clear, detailed pictures of your heart. For further information please visit [email or web address]. Please follow instruction sheet  BELOW:    Your cardiac CT will be scheduled at  [facility], [place] [address] [phone]  Please arrive at the [facility] main entrance of [facility] 30 minutes prior to test start time. Proceed to the [facility] Radiology Department (first floor) to check-in and test prep.  If scheduled at [facility]  [place], please arrive 15 mins early for check-in and test prep.  Please follow these instructions carefully (unless otherwise directed):  On the Night Before the Test: . Be sure to Drink plenty of water. . Do not consume any caffeinated/decaffeinated beverages or chocolate 12 hours prior to your test. . Do not take any antihistamines 12 hours prior to your test.   On the Day of the Test: . Drink plenty of water. Do not drink any water within one hour of the test. . Do not eat any food 4 hours prior to the test. . You may take your regular medications prior to the test.  . Take metoprolol (Lopressor) 100 mg two hours prior to test.(this has been sent to [facility]) . FEMALES- please wear underwire-free bra if available     After the Test: . Drink plenty of water. . After receiving IV contrast, you may experience a mild flushed feeling. This is normal. . On occasion, you may experience a mild rash up to 24 hours after the test. This is not dangerous. If this occurs, you can take Benadryl 25 mg and increase your fluid intake. . If you experience trouble breathing, this can be serious. If it is severe call 911 IMMEDIATELY. If it is mild, please call our office. . If you take any of these medications: Glipizide/Metformin, Avandament, Glucavance, please do not take 48 hours after completing test unless otherwise instructed.   Once we have confirmed authorization from your insurance company, we will call you to set up a date and time for your test. Based on how quickly your insurance processes prior authorizations requests, please allow up to 4 weeks to be contacted for scheduling your Cardiac CT  appointment. Be advised that routine Cardiac CT appointments could be scheduled as many as 8 weeks after your provider has ordered it.  For non-scheduling related questions, please contact the cardiac imaging nurse navigator should you have any questions/concerns: Marchia Bond, Cardiac Imaging Nurse Navigator Burley Saver, Interim Cardiac Imaging Nurse [place] [facility] Direct Office Dial: [phone]   For scheduling needs, including cancellations and rescheduling, please call [place], [phone].     Follow-Up: At [facility], you and your health needs are our priority.  As part of our continuing mission to provide you with exceptional heart care, we have created designated Provider Care Teams.  These Care Teams include your primary Cardiologist (physician) and Advanced Practice Providers (APPs -  Physician Assistants and Nurse Practitioners) who all work together to provide you with the care you need, when you need it.  We recommend signing up for the patient portal called "MyChart".  Sign up information is provided on this After Visit Summary.  MyChart is used to connect with patients for Virtual Visits (Telemedicine).  Patients are able to view lab/test results, encounter notes, upcoming appointments, etc.  Non-urgent messages can be sent to your provider as well.   To learn more about what you can do with MyChart, go to [email or web address].    Your next appointment:   6 month(s)  The format for your next appointment:   In Person  Provider:   Berniece Salines, DO   Other Instructions      Adopting a Healthy Lifestyle.  Know what a healthy weight is for you (roughly BMI <25) and aim to maintain this   Aim for 7+ servings of fruits and vegetables daily   65-80+ fluid ounces of water or unsweet tea for healthy kidneys  Limit to max 1 drink of alcohol per day; avoid smoking/tobacco   Limit animal fats in diet for cholesterol and heart  health - choose grass fed whenever available   Avoid highly processed foods, and foods high in saturated/trans fats   Aim for low stress - take time to unwind and care for your mental health   Aim for 150 min of moderate intensity exercise weekly for heart health, and weights twice weekly for bone health   Aim for 7-9 hours of sleep daily   When it comes to diets, agreement about the perfect plan isnt easy to find, even among the experts. Experts at the [employer] developed an idea known as the Healthy Eating Plate. Just imagine a plate divided into logical, healthy portions.   The emphasis is on diet quality:   Load up on vegetables and fruits - one-half of your plate: Aim for color and variety, and remember that potatoes dont count.   Go for whole grains - one-quarter of your plate: Whole wheat, barley, wheat berries, quinoa, oats, brown rice, and foods made with them. If you want pasta, go with whole wheat pasta.   Protein power - one-quarter of your plate: Fish, chicken, beans, and nuts are all healthy, versatile protein sources. Limit red meat.   The diet, however, does go beyond the plate, offering a few other suggestions.   Use healthy plant oils, such as olive, canola, soy, corn, sunflower and peanut. Check the labels, and avoid partially hydrogenated oil, which have unhealthy trans fats.   If youre thirsty, drink water. Coffee and tea are good in moderation, but skip sugary drinks and limit milk and dairy products to one or two daily servings.   The type of carbohydrate in the diet is more important than the amount. Some sources of carbohydrates, such as vegetables, fruits, whole grains, and beans-are healthier than others.   Finally, stay active  Signed, Berniece Salines, DO  10/03/2020 1:52 PM    [facility]

## 2020-10-04 ENCOUNTER — Encounter: Payer: Self-pay | Admitting: Family Medicine

## 2020-10-04 MED ORDER — FLUOXETINE HCL 20 MG PO CAPS: 20.0000 mg | ORAL_CAPSULE | Freq: Every day | ORAL | 6 refills | Status: DC

## 2020-10-04 NOTE — Telephone Encounter (Signed)
PA withdrawn from [employer].

## 2020-10-04 NOTE — Telephone Encounter (Signed)
I changed prescription to capsules, thank you.  How silly!

## 2020-10-04 NOTE — Telephone Encounter (Signed)
Per [employer] fluoxetine tablets are not covered. Okay to switch to fluoxetine capsules, or citalopram or sertraline tablets. Please let me know- I will withdraw PA if changed.

## 2020-11-20 ENCOUNTER — Telehealth: Payer: Self-pay

## 2020-11-20 LAB — BASIC METABOLIC PANEL
BUN/Creatinine Ratio: 18 (ref 12–28)
BUN: 13 mg/dL (ref 8–27)
CO2: 24 mmol/L (ref 20–29)
Calcium: 9.1 mg/dL (ref 8.7–10.3)
Chloride: 104 mmol/L (ref 96–106)
Creatinine, Ser: 0.74 mg/dL (ref 0.57–1.00)
Glucose: 118 mg/dL — ABNORMAL HIGH (ref 65–99)
Potassium: 3.9 mmol/L (ref 3.5–5.2)
Sodium: 142 mmol/L (ref 134–144)
eGFR: 91 mL/min/{1.73_m2} (ref >59)

## 2020-11-20 NOTE — Telephone Encounter (Signed)
Spoke with patient regarding results and recommendation.  Patient verbalizes understanding and is agreeable to plan of care. Advised patient to call back with any issues or concerns.  

## 2020-11-20 NOTE — Telephone Encounter (Signed)
-----   Message from Thomasene Ripple, DO sent at 11/20/2020 10:39 AM EST ----- Labs normal

## 2020-11-21 ENCOUNTER — Telehealth (HOSPITAL_COMMUNITY): Payer: Self-pay | Admitting: Emergency Medicine

## 2020-11-21 NOTE — Telephone Encounter (Signed)
Reaching out to patient to offer assistance regarding upcoming cardiac imaging study; pt verbalizes understanding of appt date/time, parking situation and where to check in, pre-test NPO status and medications ordered, and verified current allergies; name and call back number provided for further questions should they arise Rockwell Alexandria RN Navigator Cardiac Imaging [facility] [phone] office [phone] cell  Spoke with patients daughter Quitman Livings) who will be attending the appt with the patient and helping to translate  [place]

## 2020-11-22 ENCOUNTER — Ambulatory Visit (HOSPITAL_COMMUNITY)
Admission: RE | Admit: 2020-11-22 | Discharge: 2020-11-22 | Disposition: A | Discharge status: Home / Self Care | Payer: 59 | Source: Ambulatory Visit | Attending: Cardiology | Admitting: Cardiology

## 2020-11-22 ENCOUNTER — Encounter (HOSPITAL_COMMUNITY): Payer: Self-pay

## 2020-11-22 ENCOUNTER — Other Ambulatory Visit: Payer: Self-pay

## 2020-11-22 DIAGNOSIS — I200 Unstable angina: Secondary | ICD-10-CM

## 2020-11-22 MED ORDER — IOHEXOL 350 MG/ML SOLN
80.0000 mL | Freq: Once | INTRAVENOUS | Status: AC | PRN
  Administered 2020-11-22: 80 mL via INTRAVENOUS

## 2020-11-22 MED ORDER — NITROGLYCERIN 0.4 MG SL SUBL
SUBLINGUAL_TABLET | SUBLINGUAL | Status: AC
  Administered 2020-11-22: 0.8 mg via SUBLINGUAL
  Filled 2020-11-22: qty 2

## 2020-11-22 MED ORDER — NITROGLYCERIN 0.4 MG SL SUBL: 0.8000 mg | SUBLINGUAL_TABLET | Freq: Once | SUBLINGUAL | Status: AC

## 2020-11-26 ENCOUNTER — Telehealth: Payer: Self-pay | Admitting: Cardiology

## 2020-11-26 NOTE — Telephone Encounter (Signed)
Patient's daughter states she is returning a call for CT results. She is also asking if the results are urgent and if the patient can be worked in to see Dr. Servando Salina today.

## 2020-11-26 NOTE — Telephone Encounter (Signed)
Spoke with patient's daughter.

## 2020-12-07 ENCOUNTER — Other Ambulatory Visit: Payer: Self-pay

## 2020-12-07 ENCOUNTER — Encounter: Payer: Self-pay | Admitting: Cardiology

## 2020-12-07 ENCOUNTER — Ambulatory Visit (INDEPENDENT_AMBULATORY_CARE_PROVIDER_SITE_OTHER): Payer: 59 | Admitting: Cardiology

## 2020-12-07 VITALS — BP 102/64 | HR 76 | Ht 64.0 in | Wt 164.1 lb

## 2020-12-07 DIAGNOSIS — R7303 Prediabetes: Secondary | ICD-10-CM

## 2020-12-07 DIAGNOSIS — R0789 Other chest pain: Secondary | ICD-10-CM | POA: Diagnosis not present

## 2020-12-07 DIAGNOSIS — E785 Hyperlipidemia, unspecified: Secondary | ICD-10-CM

## 2020-12-07 MED ORDER — ROSUVASTATIN CALCIUM 10 MG PO TABS: 10.0000 mg | ORAL_TABLET | Freq: Every day | ORAL | 3 refills | Status: DC

## 2020-12-07 NOTE — Patient Instructions (Signed)

## 2020-12-07 NOTE — Progress Notes (Signed)
Cardiology Office Note:    Date:  12/07/2020   ID:  Amber Nichols, DOB 1955/11/12, MRN [id number]  PCP:  Amber Cables, MD  Cardiologist:  Thomasene Ripple, DO  Electrophysiologist:  None   Referring MD: Amber Cables, MD   I have been feeling better  History of Present Illness:    Amber Nichols is a 65 y.o. female with a hx of hypertension, anxiety, depression is here today for follow-up visit.  Did see the patient in January 2022 at that time she was experiencing some chest pains and shortness of breath.  At that initial visit the patient had declined an official interpreter and prefers for her daughter to interpret for her. During that visit I reviewed the record of her prior echocardiogram which was normal.  There is no need to repeat this study.  But she did have some chest pain therefore I recommended she undergo a coronary CTA.  We also stopped the bisoprolol and continue the patient on losartan 12.5 mg daily. Based on her lipid profile I asked the patient to increase her Crestor to 10 mg daily.  In the interim she was able to get her coronary CTA done which was reported to be normal.  She tells me that since her PCP optimize her depression medicine she has been able to sleep mostly and now is feeling somewhat better.  She needs refills for her Xanax and Zoloft when she is can reach out to her primary care doctor.   Past Medical History:  Diagnosis Date  . COVID-19 virus infection 2020  . Hypertension   . Night terror     Past Surgical History:  Procedure Laterality Date  . COLONOSCOPY WITH PROPOFOL N/A 02/10/2020   Procedure: COLONOSCOPY WITH PROPOFOL;  Surgeon: Wyline Mood, MD;  Location: [facility] ENDOSCOPY;  Service: Gastroenterology;  Laterality: N/A;  C-19 POSITIVE ON 01/13/20  . DILATION AND CURETTAGE OF UTERUS    . ESOPHAGOGASTRODUODENOSCOPY (EGD) WITH PROPOFOL N/A 02/10/2020   Procedure: ESOPHAGOGASTRODUODENOSCOPY (EGD) WITH PROPOFOL;  Surgeon: Wyline Mood,  MD;  Location: [facility] ENDOSCOPY;  Service: Gastroenterology;  Laterality: N/A;  . HEMORRHOID SURGERY      Current Medications: Current Meds  Medication Sig  . CALCIUM PO Take by mouth as needed.  Marland Kitchen FLUoxetine (PROZAC) 20 MG capsule Take 1 capsule (20 mg total) by mouth daily.  Marland Kitchen losartan (COZAAR) 25 MG tablet Take 12.5 mg by mouth daily.  . metFORMIN (GLUMETZA) 500 MG (MOD) 24 hr tablet Take 500 mg by mouth daily with breakfast. TAKES EVERY OTHER DAY  . predniSONE (DELTASONE) 10 MG tablet Take 10 mg by mouth as needed (PAIN).  Marland Kitchen rosuvastatin (CRESTOR) 10 MG tablet Take 1 tablet (10 mg total) by mouth daily.  Marland Kitchen VITAMIN D PO Take by mouth as needed.  . [DISCONTINUED] rosuvastatin (CRESTOR) 5 MG tablet Take 1 tablet (5 mg total) by mouth daily. (Patient taking differently: Take 10 mg by mouth daily.)     Allergies:   Patient has no known allergies.   Social History   Socioeconomic History  . Marital status: Married    Spouse name: Not on file  . Number of children: Not on file  . Years of education: Not on file  . Highest education level: Not on file  Occupational History  . Not on file  Tobacco Use  . Smoking status: Never Smoker  . Smokeless tobacco: Never Used  Vaping Use  . Vaping Use: Never used  Substance and Sexual Activity  .  Alcohol use: Never  . Drug use: Never  . Sexual activity: Not on file  Other Topics Concern  . Not on file  Social History Narrative  . Not on file   Social Determinants of Health   Financial Resource Strain: Not on file  Food Insecurity: Not on file  Transportation Needs: Not on file  Physical Activity: Not on file  Stress: Not on file  Social Connections: Not on file     Family History: The patient's family history includes Breast cancer in her sister. There is no history of Colon cancer or Esophageal cancer.  ROS:   Review of Systems  Constitution: Negative for decreased appetite, fever and weight gain.  HENT: Negative for  congestion, ear discharge, hoarse voice and sore throat.   Eyes: Negative for discharge, redness, vision loss in right eye and visual halos.  Cardiovascular: Negative for chest pain, dyspnea on exertion, leg swelling, orthopnea and palpitations.  Respiratory: Negative for cough, hemoptysis, shortness of breath and snoring.   Endocrine: Negative for heat intolerance and polyphagia.  Hematologic/Lymphatic: Negative for bleeding problem. Does not bruise/bleed easily.  Skin: Negative for flushing, nail changes, rash and suspicious lesions.  Musculoskeletal: Negative for arthritis, joint pain, muscle cramps, myalgias, neck pain and stiffness.  Gastrointestinal: Negative for abdominal pain, bowel incontinence, diarrhea and excessive appetite.  Genitourinary: Negative for decreased libido, genital sores and incomplete emptying.  Neurological: Negative for brief paralysis, focal weakness, headaches and loss of balance.  Psychiatric/Behavioral: Negative for altered mental status, depression and suicidal ideas.  Allergic/Immunologic: Negative for HIV exposure and persistent infections.    EKGs/Labs/Other Studies Reviewed:    The following studies were reviewed today:   EKG: None today  Coronary CT scan November 22, 2020 Aorta: Normal size.  No calcifications.  No dissection.  Aortic Valve:  Trileaflet.  No calcifications.  Coronary Arteries:  Normal coronary origin.  Right dominance.  RCA is a large dominant artery that gives rise to PDA and PLVB. There is no plaque.  Left main is a large artery that gives rise to LAD and LCX arteries.  LAD is a large vessel that has no plaque.  LCX is a non-dominant artery that gives rise to one large OM1 branch. There is no plaque.  Other findings:  Normal pulmonary vein drainage into the left atrium.  Normal left atrial appendage without a thrombus.  Normal size of the pulmonary artery.  IMPRESSION: 1. Coronary calcium score of 0.  This was 0 percentile for age and sex matched control.  2. Normal coronary origin with right dominance.  3. No evidence of CAD.  Recent Labs: 08/27/2020: ALT 13; TSH 0.90 09/24/2020: Hemoglobin 12.9; Platelets 302.0 11/19/2020: BUN 13; Creatinine, Ser 0.74; Potassium 3.9; Sodium 142  Recent Lipid Panel    Component Value Date/Time   CHOL 205 (H) 08/27/2020 1053   TRIG 92.0 08/27/2020 1053   HDL 55.50 08/27/2020 1053   CHOLHDL 4 08/27/2020 1053   VLDL 18.4 08/27/2020 1053   LDLCALC 131 (H) 08/27/2020 1053    Physical Exam:    VS:  BP 102/64   Pulse 76   Ht 5\' 4"  (1.626 m)   Wt 164 lb 1.3 oz (74.4 kg)   SpO2 98%   BMI 28.16 kg/m     Wt Readings from Last 3 Encounters:  12/07/20 164 lb 1.3 oz (74.4 kg)  10/03/20 165 lb (74.8 kg)  09/24/20 166 lb (75.3 kg)     GEN: Well nourished, well developed in no  acute distress HEENT: Normal NECK: No JVD; No carotid bruits LYMPHATICS: No lymphadenopathy CARDIAC: S1S2 noted,RRR, no murmurs, rubs, gallops RESPIRATORY:  Clear to auscultation without rales, wheezing or rhonchi  ABDOMEN: Soft, non-tender, non-distended, +bowel sounds, no guarding. EXTREMITIES: No edema, No cyanosis, no clubbing MUSCULOSKELETAL:  No deformity  SKIN: Warm and dry NEUROLOGIC:  Alert and oriented x 3, non-focal PSYCHIATRIC:  Normal affect, good insight  ASSESSMENT:    1. Hyperlipidemia, unspecified hyperlipidemia type   2. Atypical chest pain   3. Prediabetes    PLAN:     Discussed with the patient informing her that her coronary CTA did not show any evidence of coronary artery disease.  She will continue her current medication regimen for her hypertension as well as as her hyperlipidemia.  She plans to follow her PCP as she will need refills on her antidepressant and antianxiety medication. She intends to fast doing the month of Ramadan she therefore will continue to monitor her blood pressure.  She plans to take her antihypertensive medication  with water during this time.  The patient is in agreement with the above plan. The patient left the office in stable condition.  The patient will follow up in 6 months or sooner if needed.  I do believe her atypical chest pain is in the setting of anxiety.   Medication Adjustments/Labs and Tests Ordered: Current medicines are reviewed at length with the patient today.  Concerns regarding medicines are outlined above.  No orders of the defined types were placed in this encounter.  Meds ordered this encounter  Medications  . rosuvastatin (CRESTOR) 10 MG tablet    Sig: Take 1 tablet (10 mg total) by mouth daily.    Dispense:  90 tablet    Refill:  3    Patient Instructions  Medication Instructions:  Your physician recommends that you continue on your current medications as directed. Please refer to the Current Medication list given to you today.  *If you need a refill on your cardiac medications before your next appointment, please call your pharmacy*   Lab Work: None If you have labs (blood work) drawn today and your tests are completely normal, you will receive your results only by: Marland Kitchen. MyChart Message (if you have MyChart) OR . A paper copy in the mail If you have any lab test that is abnormal or we need to change your treatment, we will call you to review the results.   Testing/Procedures: None   Follow-Up: At [facility], you and your health needs are our priority.  As part of our continuing mission to provide you with exceptional heart care, we have created designated Provider Care Teams.  These Care Teams include your primary Cardiologist (physician) and Advanced Practice Providers (APPs -  Physician Assistants and Nurse Practitioners) who all work together to provide you with the care you need, when you need it.  We recommend signing up for the patient portal called "MyChart".  Sign up information is provided on this After Visit Summary.  MyChart is used to connect with  patients for Virtual Visits (Telemedicine).  Patients are able to view lab/test results, encounter notes, upcoming appointments, etc.  Non-urgent messages can be sent to your provider as well.   To learn more about what you can do with MyChart, go to [email or web address].    Your next appointment:   6 month(s)  The format for your next appointment:   In Person  Provider:   Thomasene RippleKardie Destina Mantei, DO   Other  Instructions      Adopting a Healthy Lifestyle.  Know what a healthy weight is for you (roughly BMI <25) and aim to maintain this   Aim for 7+ servings of fruits and vegetables daily   65-80+ fluid ounces of water or unsweet tea for healthy kidneys   Limit to max 1 drink of alcohol per day; avoid smoking/tobacco   Limit animal fats in diet for cholesterol and heart health - choose grass fed whenever available   Avoid highly processed foods, and foods high in saturated/trans fats   Aim for low stress - take time to unwind and care for your mental health   Aim for 150 min of moderate intensity exercise weekly for heart health, and weights twice weekly for bone health   Aim for 7-9 hours of sleep daily   When it comes to diets, agreement about the perfect plan isnt easy to find, even among the experts. Experts at the [facility] [employer] developed an idea known as the Healthy Eating Plate. Just imagine a plate divided into logical, healthy portions.   The emphasis is on diet quality:   Load up on vegetables and fruits - one-half of your plate: Aim for color and variety, and remember that potatoes dont count.   Go for whole grains - one-quarter of your plate: Whole wheat, barley, wheat berries, quinoa, oats, brown rice, and foods made with them. If you want pasta, go with whole wheat pasta.   Protein power - one-quarter of your plate: Fish, chicken, beans, and nuts are all healthy, versatile protein sources. Limit red meat.   The diet, however, does go beyond the  plate, offering a few other suggestions.   Use healthy plant oils, such as olive, canola, soy, corn, sunflower and peanut. Check the labels, and avoid partially hydrogenated oil, which have unhealthy trans fats.   If youre thirsty, drink water. Coffee and tea are good in moderation, but skip sugary drinks and limit milk and dairy products to one or two daily servings.   The type of carbohydrate in the diet is more important than the amount. Some sources of carbohydrates, such as vegetables, fruits, whole grains, and beans-are healthier than others.   Finally, stay active  Signed, Thomasene Ripple, DO  12/07/2020 2:13 PM    [facility]

## 2021-01-18 ENCOUNTER — Other Ambulatory Visit: Payer: Self-pay | Admitting: Orthopedic Surgery

## 2021-02-18 ENCOUNTER — Other Ambulatory Visit: Payer: Self-pay

## 2021-02-18 MED ORDER — ROSUVASTATIN CALCIUM 10 MG PO TABS: 10.0000 mg | ORAL_TABLET | Freq: Every day | ORAL | 3 refills | Status: DC

## 2021-04-16 ENCOUNTER — Telehealth: Payer: Self-pay | Admitting: Cardiology

## 2021-04-16 NOTE — Telephone Encounter (Signed)
Pt c/o medication issue: 1. Name of Medication: they patient daughter not sure 2. How are you currently taking this medication (dosage and times per day)? Not sure 3. Are you having a reaction (difficulty breathing--STAT)?  No Chest discomfort  4. What is your medication issue? Patient went out the country and received medication from the doctor there. Please advise.

## 2021-04-16 NOTE — Telephone Encounter (Signed)
Daughter number [phone]

## 2021-04-16 NOTE — Telephone Encounter (Signed)
Left message for patient to return the call.

## 2021-04-17 NOTE — Telephone Encounter (Signed)
I attempt to call but no answer or VM to leave a message.

## 2021-05-02 ENCOUNTER — Telehealth: Payer: Self-pay | Admitting: Cardiology

## 2021-05-02 NOTE — Telephone Encounter (Signed)
Pt c/o medication issue:  1. Name of Medication: Something prescribed by her Doctor in [place] ( Daughter is not sure what it is called). She said Dr. Servando Salina should be aware of this medication  2. How are you currently taking this medication (dosage and times per day)?   3. Are you having a reaction (difficulty breathing--STAT)?   4. What is your medication issue? The daughter wanted to know if the patient should keep taking this medication or start taking something similar. Please advise

## 2021-05-02 NOTE — Telephone Encounter (Signed)
Called daughter she want sher mother to see Dr. Servando Salina due to recent travel and being started on a medication that has been helping. She is not able to get the medication refilled and needs another medication similar to the medication. She wants her mother to be evaluated by Dr. Servando Salina. She is added to schedule to be seen soon. She thanked me for calling her back.

## 2021-05-22 ENCOUNTER — Ambulatory Visit (INDEPENDENT_AMBULATORY_CARE_PROVIDER_SITE_OTHER): Payer: 59 | Admitting: Cardiology

## 2021-05-22 ENCOUNTER — Encounter: Payer: Self-pay | Admitting: Cardiology

## 2021-05-22 ENCOUNTER — Other Ambulatory Visit: Payer: Self-pay

## 2021-05-22 VITALS — BP 116/72 | HR 66 | Ht 64.0 in | Wt 166.0 lb

## 2021-05-22 DIAGNOSIS — R0789 Other chest pain: Secondary | ICD-10-CM

## 2021-05-22 DIAGNOSIS — I10 Essential (primary) hypertension: Secondary | ICD-10-CM | POA: Diagnosis not present

## 2021-05-22 DIAGNOSIS — E782 Mixed hyperlipidemia: Secondary | ICD-10-CM | POA: Diagnosis not present

## 2021-05-22 MED ORDER — METOPROLOL SUCCINATE ER 50 MG PO TB24: 50.0000 mg | ORAL_TABLET | Freq: Every day | ORAL | 3 refills | Status: DC

## 2021-05-22 MED ORDER — ROSUVASTATIN CALCIUM 10 MG PO TABS
10.0000 mg | ORAL_TABLET | Freq: Every day | ORAL | 3 refills | Status: DC
Start: 2021-05-22 — End: 2021-10-16

## 2021-05-22 NOTE — Patient Instructions (Signed)
Medication Instructions:  Your physician has recommended you make the following change in your medication:  START: Metoprolol succinate 50 mg daily Refilled: Crestor 10 mg *If you need a refill on your cardiac medications before your next appointment, please call your pharmacy*   Lab Work: None If you have labs (blood work) drawn today and your tests are completely normal, you will receive your results only by: MyChart Message (if you have MyChart) OR A paper copy in the mail If you have any lab test that is abnormal or we need to change your treatment, we will call you to review the results.   Testing/Procedures: None   Follow-Up: At [facility], you and your health needs are our priority.  As part of our continuing mission to provide you with exceptional heart care, we have created designated Provider Care Teams.  These Care Teams include your primary Cardiologist (physician) and Advanced Practice Providers (APPs -  Physician Assistants and Nurse Practitioners) who all work together to provide you with the care you need, when you need it.  We recommend signing up for the patient portal called "MyChart".  Sign up information is provided on this After Visit Summary.  MyChart is used to connect with patients for Virtual Visits (Telemedicine).  Patients are able to view lab/test results, encounter notes, upcoming appointments, etc.  Non-urgent messages can be sent to your provider as well.   To learn more about what you can do with MyChart, go to [email or web address].    Your next appointment:   1 year(s)  The format for your next appointment:   In Person  Provider:   Thomasene Ripple, DO [address], [place], [place] [address]   Other Instructions

## 2021-05-22 NOTE — Progress Notes (Signed)
Cardiology Office Note:    Date:  05/22/2021   ID:  Amber Nichols, DOB [birth year], MRN [id number]  PCP:  Pearline Cables, MD  Cardiologist:  Thomasene Ripple, DO  Electrophysiologist:  None   Referring MD: Pearline Cables, MD   I am doing very well  History of Present Illness:    Amber Nichols is a 65 y.o. female with a hx of hypertension, anxiety, depression is here today for follow-up visit.Did see the patient in January 2022 at that time she was experiencing some chest pains and shortness of breath.  At that initial visit the patient had declined an official interpreter and prefers for her daughter to interpret for her.  During that visit I reviewed the record of her prior echocardiogram which was normal.  There is no need to repeat this study.  But she did have some chest pain therefore I recommended she undergo a coronary CTA.  We also stopped the bisoprolol and continue the patient on losartan 12.5 mg daily. Based on her lipid profile I asked the patient to increase her Crestor to 10 mg daily.  She was seen on December 07, 2020 at that time we review her coronary CTA which showed normal coronaries with no evidence of CAD.  She was having trouble with refills on her Xanax and Zoloft, but had a scheduled follow-up with the primary team.  Since I saw the patient she has visited her home country and has had some medication changes.  She reports that she has been doing well from a cardiovascular standpoint.  Palpitations has improved she has not had any significant chest pain.   Past Medical History:  Diagnosis Date   COVID-19 virus infection 2020   Hypertension    Night terror     Past Surgical History:  Procedure Laterality Date   COLONOSCOPY WITH PROPOFOL N/A 02/10/2020   Procedure: COLONOSCOPY WITH PROPOFOL;  Surgeon: Wyline Mood, MD;  Location: [facility] ENDOSCOPY;  Service: Gastroenterology;  Laterality: N/A;  C-19 POSITIVE ON 01/13/20   DILATION AND CURETTAGE OF UTERUS      ESOPHAGOGASTRODUODENOSCOPY (EGD) WITH PROPOFOL N/A 02/10/2020   Procedure: ESOPHAGOGASTRODUODENOSCOPY (EGD) WITH PROPOFOL;  Surgeon: Wyline Mood, MD;  Location: [facility] ENDOSCOPY;  Service: Gastroenterology;  Laterality: N/A;   HEMORRHOID SURGERY      Current Medications: Current Meds  Medication Sig   ALPRAZolam (XANAX) 0.5 MG tablet Take 0.5 mg by mouth at bedtime as needed for anxiety.   metoprolol succinate (TOPROL-XL) 50 MG 24 hr tablet Take 1 tablet (50 mg total) by mouth daily. Take with or immediately following a meal.   [DISCONTINUED] rosuvastatin (CRESTOR) 10 MG tablet Take 1 tablet (10 mg total) by mouth daily.     Allergies:   Patient has no known allergies.   Social History   Socioeconomic History   Marital status: Married    Spouse name: Not on file   Number of children: Not on file   Years of education: Not on file   Highest education level: Not on file  Occupational History   Not on file  Tobacco Use   Smoking status: Never   Smokeless tobacco: Never  Vaping Use   Vaping Use: Never used  Substance and Sexual Activity   Alcohol use: Never   Drug use: Never   Sexual activity: Not on file  Other Topics Concern   Not on file  Social History Narrative   Not on file   Social Determinants of Health   Financial Resource  Strain: Not on file  Food Insecurity: Not on file  Transportation Needs: Not on file  Physical Activity: Not on file  Stress: Not on file  Social Connections: Not on file     Family History: The patient's family history includes Breast cancer in her sister. There is no history of Colon cancer or Esophageal cancer.  ROS:   Review of Systems  Constitution: Negative for decreased appetite, fever and weight gain.  HENT: Negative for congestion, ear discharge, hoarse voice and sore throat.   Eyes: Negative for discharge, redness, vision loss in right eye and visual halos.  Cardiovascular: Negative for chest pain, dyspnea on exertion, leg  swelling, orthopnea and palpitations.  Respiratory: Negative for cough, hemoptysis, shortness of breath and snoring.   Endocrine: Negative for heat intolerance and polyphagia.  Hematologic/Lymphatic: Negative for bleeding problem. Does not bruise/bleed easily.  Skin: Negative for flushing, nail changes, rash and suspicious lesions.  Musculoskeletal: Negative for arthritis, joint pain, muscle cramps, myalgias, neck pain and stiffness.  Gastrointestinal: Negative for abdominal pain, bowel incontinence, diarrhea and excessive appetite.  Genitourinary: Negative for decreased libido, genital sores and incomplete emptying.  Neurological: Negative for brief paralysis, focal weakness, headaches and loss of balance.  Psychiatric/Behavioral: Negative for altered mental status, depression and suicidal ideas.  Allergic/Immunologic: Negative for HIV exposure and persistent infections.    EKGs/Labs/Other Studies Reviewed:    The following studies were reviewed today:   EKG: None today  Coronary CT scan November 22, 2020 Aorta: Normal size.  No calcifications.  No dissection.   Aortic Valve:  Trileaflet.  No calcifications.   Coronary Arteries:  Normal coronary origin.  Right dominance.   RCA is a large dominant artery that gives rise to PDA and PLVB. There is no plaque.   Left main is a large artery that gives rise to LAD and LCX arteries.   LAD is a large vessel that has no plaque.   LCX is a non-dominant artery that gives rise to one large OM1 branch. There is no plaque.   Other findings:   Normal pulmonary vein drainage into the left atrium.   Normal left atrial appendage without a thrombus.   Normal size of the pulmonary artery.   IMPRESSION: 1. Coronary calcium score of 0. This was 0 percentile for age and sex matched control.   2. Normal coronary origin with right dominance.   3. No evidence of CAD.  Recent Labs: 08/27/2020: ALT 13; TSH 0.90 09/24/2020: Hemoglobin 12.9;  Platelets 302.0 11/19/2020: BUN 13; Creatinine, Ser 0.74; Potassium 3.9; Sodium 142  Recent Lipid Panel    Component Value Date/Time   CHOL 205 (H) 08/27/2020 1053   TRIG 92.0 08/27/2020 1053   HDL 55.50 08/27/2020 1053   CHOLHDL 4 08/27/2020 1053   VLDL 18.4 08/27/2020 1053   LDLCALC 131 (H) 08/27/2020 1053    Physical Exam:    VS:  BP 116/72 (BP Location: Right Arm, Patient Position: Sitting)   Pulse 66   Ht 5\' 4"  (1.626 m)   Wt 166 lb (75.3 kg)   SpO2 98%   BMI 28.49 kg/m     Wt Readings from Last 3 Encounters:  05/22/21 166 lb (75.3 kg)  12/07/20 164 lb 1.3 oz (74.4 kg)  10/03/20 165 lb (74.8 kg)     GEN: Well nourished, well developed in no acute distress HEENT: Normal NECK: No JVD; No carotid bruits LYMPHATICS: No lymphadenopathy CARDIAC: S1S2 noted,RRR, no murmurs, rubs, gallops RESPIRATORY:  Clear to auscultation  without rales, wheezing or rhonchi  ABDOMEN: Soft, non-tender, non-distended, +bowel sounds, no guarding. EXTREMITIES: No edema, No cyanosis, no clubbing MUSCULOSKELETAL:  No deformity  SKIN: Warm and dry NEUROLOGIC:  Alert and oriented x 3, non-focal PSYCHIATRIC:  Normal affect, good insight  ASSESSMENT:    1. Hypertension, unspecified type   2. Atypical chest pain   3. Mixed hyperlipidemia    PLAN:     He was taken off losartan and started on Toprol-XL.  She is taking Toprol XL 47.5mg  which was a dose given to her in [place].  I discussed with the patient and her daughter that we do not have this exact dosing here in the [place] shared decision we will have the patient take 50 mg metoprolol succinate daily.  I will refill her Crestor 10 mg daily for hyperlipidemia. She will discuss with her primary care for questions about her antidepressants.  She had an echocardiogram done in around which was noted to be normal.  I have requested that the patient either upload these results in MyChart or send [place] that information for our records.   The  patient is in agreement with the above plan. The patient left the office in stable condition.  The patient will follow up in 1 year or sooner if needed.   Medication Adjustments/Labs and Tests Ordered: Current medicines are reviewed at length with the patient today.  Concerns regarding medicines are outlined above.  No orders of the defined types were placed in this encounter.  Meds ordered this encounter  Medications   rosuvastatin (CRESTOR) 10 MG tablet    Sig: Take 1 tablet (10 mg total) by mouth daily.    Dispense:  90 tablet    Refill:  3   metoprolol succinate (TOPROL-XL) 50 MG 24 hr tablet    Sig: Take 1 tablet (50 mg total) by mouth daily. Take with or immediately following a meal.    Dispense:  90 tablet    Refill:  3    Patient Instructions  Medication Instructions:  Your physician has recommended you make the following change in your medication:  START: Metoprolol succinate 50 mg daily Refilled: Crestor 10 mg *If you need a refill on your cardiac medications before your next appointment, please call your pharmacy*   Lab Work: None If you have labs (blood work) drawn today and your tests are completely normal, you will receive your results only by: MyChart Message (if you have MyChart) OR A paper copy in the mail If you have any lab test that is abnormal or we need to change your treatment, we will call you to review the results.   Testing/Procedures: None   Follow-Up: At [facility], you and your health needs are our priority.  As part of our continuing mission to provide you with exceptional heart care, we have created designated Provider Care Teams.  These Care Teams include your primary Cardiologist (physician) and Advanced Practice Providers (APPs -  Physician Assistants and Nurse Practitioners) who all work together to provide you with the care you need, when you need it.  We recommend signing up for the patient portal called "MyChart".  Sign up  information is provided on this After Visit Summary.  MyChart is used to connect with patients for Virtual Visits (Telemedicine).  Patients are able to view lab/test results, encounter notes, upcoming appointments, etc.  Non-urgent messages can be sent to your provider as well.   To learn more about what you can do with  MyChart, go to [email or web address].    Your next appointment:   1 year(s)  The format for your next appointment:   In Person  Provider:   Thomasene RippleKardie Erhard Senske, DO [address], [place], [place] [id number]   Other Instructions     Adopting a Healthy Lifestyle.  Know what a healthy weight is for you (roughly BMI <25) and aim to maintain this   Aim for 7+ servings of fruits and vegetables daily   65-80+ fluid ounces of water or unsweet tea for healthy kidneys   Limit to max 1 drink of alcohol per day; avoid smoking/tobacco   Limit animal fats in diet for cholesterol and heart health - choose grass fed whenever available   Avoid highly processed foods, and foods high in saturated/trans fats   Aim for low stress - take time to unwind and care for your mental health   Aim for 150 min of moderate intensity exercise weekly for heart health, and weights twice weekly for bone health   Aim for 7-9 hours of sleep daily   When it comes to diets, agreement about the perfect plan isnt easy to find, even among the experts. Experts at the [employer] developed an idea known as the Healthy Eating Plate. Just imagine a plate divided into logical, healthy portions.   The emphasis is on diet quality:   Load up on vegetables and fruits - one-half of your plate: Aim for color and variety, and remember that potatoes dont count.   Go for whole grains - one-quarter of your plate: Whole wheat, barley, wheat berries, quinoa, oats, brown rice, and foods made with them. If you want pasta, go with whole wheat pasta.   Protein power - one-quarter of your plate:  Fish, chicken, beans, and nuts are all healthy, versatile protein sources. Limit red meat.   The diet, however, does go beyond the plate, offering a few other suggestions.   Use healthy plant oils, such as olive, canola, soy, corn, sunflower and peanut. Check the labels, and avoid partially hydrogenated oil, which have unhealthy trans fats.   If youre thirsty, drink water. Coffee and tea are good in moderation, but skip sugary drinks and limit milk and dairy products to one or two daily servings.   The type of carbohydrate in the diet is more important than the amount. Some sources of carbohydrates, such as vegetables, fruits, whole grains, and beans-are healthier than others.   Finally, stay active  Signed, Thomasene RippleKardie Saina Waage, DO  05/22/2021 12:43 PM     [facility]

## 2021-07-23 NOTE — Progress Notes (Signed)
[facility] [address] [place], [place] [address] [phone]  Date:  07/29/2021   Name:  Amber Nichols   DOB:  03-27-1956   MRN:  [id number]  PCP:  Pearline Cables, MD    Chief Complaint: Follow-up (Daughter says her insurance will be changing soon and asks if this can be her CPE and needs a physical./Concerns/ questions: knee pain- has seen ortho, does she need xray now? /Flu shot today:/)   History of Present Illness:  Amber Nichols is a 65 y.o. very pleasant female patient who presents with the following:  Patient seen today for annual Physical exam Last visit with myself was in January History of hypertension, she has cardiology care  Patient requires an interpreter due to language barrier, she prefers to have her daughter serve in this role  At her last visit she has some unusual symptoms including a general sense of dread and night terrors-thought likely related to anxiety and depression.  I started her on fluoxetine which she had taken previously- however she stopped taking it She had been taking alprazolam 0.25 at bedtime.  She has been taking this for years; she prefers taking 1/2 of the 0.5 mg.  They note this has cleared up her night terrors -they would like to continue this medication.  I did advise that this medication is sedating and habit-forming  Most recent visit with cardiology, Dr. Servando Salina was in [place] that time she seem to be doing well.  She was taking Toprol-XL which had improved her palpitations  Pap smear- about 5 years ago, will update today  Shingrix-recommended COVID booster-recommended Flu shot-give today  Mammogram due in December- however it looks like she was supposed to do a 6 month recheck after her last mammo, this was not done.  I provided the reminder letter from mammogram facility to her daughter who will call Colon cancer screen is up-to-date Needs a full set of  labs today  She has right knee pain- she saw ortho, Dr Lajoyce Corners- last week and got an oral steroid rx  She has not yet had an x-ray of her knee and would like to do this today  She notes that the medial aspect of her right knee is quite painful, she describes it as an electrical shock sensation, she feels like the knee is too tight and difficult to straighten No particular injury that she can recall Patient Active Problem List   Diagnosis Date Noted   Precordial chest pain 10/03/2020   Dyspnea on exertion 10/03/2020   Night terror    Hypertension    COVID-19 virus infection 2020    Past Medical History:  Diagnosis Date   COVID-19 virus infection 2020   Hypertension    Night terror     Past Surgical History:  Procedure Laterality Date   COLONOSCOPY WITH PROPOFOL N/A 02/10/2020   Procedure: COLONOSCOPY WITH PROPOFOL;  Surgeon: Wyline Mood, MD;  Location: [facility] ENDOSCOPY;  Service: Gastroenterology;  Laterality: N/A;  C-19 POSITIVE ON 01/13/20   DILATION AND CURETTAGE OF UTERUS     ESOPHAGOGASTRODUODENOSCOPY (EGD) WITH PROPOFOL N/A 02/10/2020   Procedure: ESOPHAGOGASTRODUODENOSCOPY (EGD) WITH PROPOFOL;  Surgeon: Wyline Mood, MD;  Location: [facility] ENDOSCOPY;  Service: Gastroenterology;  Laterality: N/A;   HEMORRHOID SURGERY      Social History   Tobacco Use   Smoking status: Never   Smokeless tobacco: Never  Vaping Use   Vaping Use: Never used  Substance Use Topics   Alcohol use: Never   Drug use: Never    Family History  Problem Relation Age of Onset   Breast cancer Sister    Colon cancer Neg Hx    Esophageal cancer Neg Hx     No Known Allergies  Medication list has been reviewed and updated.  Current Outpatient Medications on File Prior to Visit  Medication Sig Dispense Refill   ALPRAZolam (XANAX) 0.5 MG tablet Take 0.5 mg by mouth at bedtime as needed for anxiety.     gabapentin (NEURONTIN) 100 MG capsule Take 1 capsule (100 mg total) by mouth at bedtime. 90 capsule 1    metoprolol succinate (TOPROL-XL) 50 MG 24 hr tablet Take 1 tablet (50 mg total) by mouth daily. Take with or immediately following a meal. (Patient taking differently: Take 50 mg by mouth daily. Taking 1/2 nightly.) 90 tablet 3   rosuvastatin (CRESTOR) 10 MG tablet Take 1 tablet (10 mg total) by mouth daily. 90 tablet 3   No current facility-administered medications on file prior to visit.    Review of Systems:  As per HPI- otherwise negative.   Physical Examination: Vitals:   07/29/21 0951  BP: 122/70  Pulse: 70  Resp: 18  Temp: (!) 97.5 F (36.4 C)  SpO2: 99%   Vitals:   07/29/21 0951  Weight: 160 lb 9.6 oz (72.8 kg)  Height: 5\' 4"  (1.626 m)   Body mass index is 27.57 kg/m. Ideal Body Weight: Weight in (lb) to have BMI = 25: 145.3  GEN: no acute distress.  Mild overweight, looks well  HEENT: Atraumatic, Normocephalic. Bilateral TM wnl, oropharynx normal.  PEERL,EOMI.   Ears and Nose: No external deformity. CV: RRR, No M/G/R. No JVD. No thrill. No extra heart sounds. PULM: CTA B, no wheezes, crackles, rhonchi. No retractions. No resp. distress. No accessory muscle use. ABD: S, NT, ND, +BS. No rebound. No HSM. EXTR: No c/c/e PSYCH: Normally interactive. Conversant.  Right knee: Small effusion is present.  Ligaments feel stable.  Patient has significant tenderness over specific point of the medial knee, no redness or heat is noted Pelvic: normal, no vaginal lesions or discharge. Uterus normal, no CMT, no adnexal tendereness or masses   Assessment and Plan: Physical exam  Anxiety and depression  Fatigue, unspecified type - Plan: CBC, TSH, VITAMIN D 25 Hydroxy (Vit-D Deficiency, Fractures)  Screening for diabetes mellitus - Plan: Comprehensive metabolic panel, Hemoglobin A1c  Essential hypertension - Plan: CBC, Comprehensive metabolic panel  Screening for hyperlipidemia - Plan: Lipid panel  Chronic pain of right knee - Plan: DG Knee Complete 4 Views  Right  Screening for cervical cancer - Plan: Cytology - PAP  Night terror - Plan: ALPRAZolam (XANAX) 0.5 MG tablet  Need for influenza vaccination - Plan: Flu Vaccine QUAD 6+ mos PF IM (Fluarix Quad PF)  Patient seen today for physical exam  She has apparently been taking alprazolam which she gets from her [place] for several years.  As she has been taking this consistently I will refill it at current dosage.  It has cleared up the night terrors which were bothering her previously She notes that she still tends to feel quite tired, lab evaluation pending as above Pap today, flu shot given Reminded that mammogram is due Will obtain x-rays for chronic knee pain, will send a message to her orthopedist regarding continued pain   Signed [place], MD  Received her labs as below, message to  patient-x-rays not yet available  Results for orders placed or performed in visit on 07/29/21  CBC  Result Value Ref Range   WBC 9.9 4.0 - 10.5 K/uL   RBC 4.31 3.87 - 5.11 Mil/uL   Platelets 256.0 150.0 - 400.0 K/uL   Hemoglobin 12.9 12.0 - 15.0 g/dL   HCT 00.8 67.6 - 19.5 %   MCV 90.8 78.0 - 100.0 fl   MCHC 33.0 30.0 - 36.0 g/dL   RDW 09.3 26.7 - 12.4 %  Comprehensive metabolic panel  Result Value Ref Range   Sodium 142 135 - 145 mEq/L   Potassium 3.8 3.5 - 5.1 mEq/L   Chloride 103 96 - 112 mEq/L   CO2 33 (H) 19 - 32 mEq/L   Glucose, Bld 91 70 - 99 mg/dL   BUN 21 6 - 23 mg/dL   Creatinine, Ser 5.80 0.40 - 1.20 mg/dL   Total Bilirubin 0.5 0.2 - 1.2 mg/dL   Alkaline Phosphatase 51 39 - 117 U/L   AST 15 0 - 37 U/L   ALT 14 0 - 35 U/L   Total Protein 7.0 6.0 - 8.3 g/dL   Albumin 4.3 3.5 - 5.2 g/dL   GFR 99.83 >38.25 mL/min   Calcium 9.1 8.4 - 10.5 mg/dL  Hemoglobin K5L  Result Value Ref Range   Hgb A1c MFr Bld 6.4 4.6 - 6.5 %  Lipid panel  Result Value Ref Range   Cholesterol 128 0 - 200 mg/dL   Triglycerides 976.7 0.0 - 149.0 mg/dL   HDL 34.19 >37.90 mg/dL   VLDL  24.0 0.0 - 97.3 mg/dL   LDL Cholesterol 43 0 - 99 mg/dL   Total CHOL/HDL Ratio 2    NonHDL 66.00   TSH  Result Value Ref Range   TSH 0.68 0.35 - 5.50 uIU/mL  VITAMIN D 25 Hydroxy (Vit-D Deficiency, Fractures)  Result Value Ref Range   VITD 33.65 30.00 - 100.00 ng/mL

## 2021-07-23 NOTE — Patient Instructions (Addendum)
Good to see you today, I will be in touch with your labs and pap asap Please go to lab and then to the ground floor to have your x-ray done Flu shot given today I would also recommend getting the covid booster and shingles vaccine asap  Your mammogram is due to be updated- please see letter from [facility] breast center   [facility] Xxx to take the alprazolam 0.25 at bedtime if necessary

## 2021-07-24 ENCOUNTER — Ambulatory Visit (INDEPENDENT_AMBULATORY_CARE_PROVIDER_SITE_OTHER): Payer: 59 | Admitting: Family

## 2021-07-24 ENCOUNTER — Encounter: Payer: Self-pay | Admitting: Family

## 2021-07-24 DIAGNOSIS — G8929 Other chronic pain: Secondary | ICD-10-CM

## 2021-07-24 DIAGNOSIS — M5441 Lumbago with sciatica, right side: Secondary | ICD-10-CM

## 2021-07-24 DIAGNOSIS — M25561 Pain in right knee: Secondary | ICD-10-CM

## 2021-07-24 MED ORDER — GABAPENTIN 100 MG PO CAPS: 100.0000 mg | ORAL_CAPSULE | Freq: Every day | ORAL | 1 refills | Status: DC

## 2021-07-24 MED ORDER — PREDNISONE 50 MG PO TABS: ORAL_TABLET | ORAL | 0 refills | Status: DC

## 2021-07-24 MED ORDER — PREDNISONE 10 MG PO TABS: 10.0000 mg | ORAL_TABLET | Freq: Every day | ORAL | 1 refills | Status: DC

## 2021-07-24 NOTE — Progress Notes (Deleted)
Office Visit Note   Patient: Amber Nichols           Date of Birth: 1956-04-23           MRN: [id number] Visit Date: 07/24/2021              Requested by: Pearline Cables, MD [address] [place],  [place] [address] PCP: Pearline Cables, MD  No chief complaint on file.     HPI:   Assessment & Plan: Visit Diagnoses:  1. Chronic pain of right knee   2. Chronic right-sided low back pain with right-sided sciatica     Plan: ***  Follow-Up Instructions: No follow-ups on file.   Ortho Exam  Patient is alert, oriented, no adenopathy, well-dressed, normal affect, normal respiratory effort. ***  Imaging: No results found. No images are attached to the encounter.  Labs: Lab Results  Component Value Date   HGBA1C 5.8 08/27/2020     Lab Results  Component Value Date   ALBUMIN 4.3 08/27/2020   ALBUMIN 4.4 11/18/2019    No results found for: MG Lab Results  Component Value Date   VD25OH 30.91 08/27/2020    No results found for: PREALBUMIN CBC EXTENDED Latest Ref Rng & Units 09/24/2020 08/27/2020 11/18/2019  WBC 4.0 - 10.5 K/uL 8.7 5.4 6.4  RBC 3.87 - 5.11 Mil/uL 4.24 4.21 -  HGB 12.0 - 15.0 g/dL 49.4 49.6 75.9  HCT 16.3 - 46.0 % 38.6 38.3 38  PLT 150.0 - 400.0 K/uL 302.0 247.0 247     There is no height or weight on file to calculate BMI.  Orders:  No orders of the defined types were placed in this encounter.  Meds ordered this encounter  Medications   predniSONE (DELTASONE) 50 MG tablet    Sig: Take one tablet by mouth once daily for 5 days.    Dispense:  5 tablet    Refill:  0   predniSONE (DELTASONE) 10 MG tablet    Sig: Take 1 tablet (10 mg total) by mouth daily with breakfast.    Dispense:  60 tablet    Refill:  1   gabapentin (NEURONTIN) 100 MG capsule    Sig: Take 1 capsule (100 mg total) by mouth at bedtime.    Dispense:  90 capsule    Refill:  1     Procedures: No procedures performed  Clinical Data: No  additional findings.  ROS:  All other systems negative, except as noted in the HPI. Review of Systems  Objective: Vital Signs: There were no vitals taken for this visit.  Specialty Comments:  No specialty comments available.  PMFS History: Patient Active Problem List   Diagnosis Date Noted   Precordial chest pain 10/03/2020   Dyspnea on exertion 10/03/2020   Night terror    Hypertension    COVID-19 virus infection 2020   Past Medical History:  Diagnosis Date   COVID-19 virus infection 2020   Hypertension    Night terror     Family History  Problem Relation Age of Onset   Breast cancer Sister    Colon cancer Neg Hx    Esophageal cancer Neg Hx     Past Surgical History:  Procedure Laterality Date   COLONOSCOPY WITH PROPOFOL N/A 02/10/2020   Procedure: COLONOSCOPY WITH PROPOFOL;  Surgeon: Wyline Mood, MD;  Location: [facility] ENDOSCOPY;  Service: Gastroenterology;  Laterality: N/A;  C-19 POSITIVE ON 01/13/20   DILATION AND CURETTAGE OF UTERUS  ESOPHAGOGASTRODUODENOSCOPY (EGD) WITH PROPOFOL N/A 02/10/2020   Procedure: ESOPHAGOGASTRODUODENOSCOPY (EGD) WITH PROPOFOL;  Surgeon: Wyline Mood, MD;  Location: [facility] ENDOSCOPY;  Service: Gastroenterology;  Laterality: N/A;   HEMORRHOID SURGERY     Social History   Occupational History   Not on file  Tobacco Use   Smoking status: Never   Smokeless tobacco: Never  Vaping Use   Vaping Use: Never used  Substance and Sexual Activity   Alcohol use: Never   Drug use: Never   Sexual activity: Not on file

## 2021-07-29 ENCOUNTER — Other Ambulatory Visit (HOSPITAL_COMMUNITY)
Admission: RE | Admit: 2021-07-29 | Discharge: 2021-07-29 | Disposition: A | Discharge status: Home / Self Care | Payer: 59 | Source: Ambulatory Visit | Attending: Family Medicine | Admitting: Family Medicine

## 2021-07-29 ENCOUNTER — Other Ambulatory Visit: Payer: Self-pay

## 2021-07-29 ENCOUNTER — Ambulatory Visit (HOSPITAL_BASED_OUTPATIENT_CLINIC_OR_DEPARTMENT_OTHER)
Admission: RE | Admit: 2021-07-29 | Discharge: 2021-07-29 | Disposition: A | Discharge status: Home / Self Care | Payer: 59 | Source: Ambulatory Visit | Attending: Family Medicine | Admitting: Family Medicine

## 2021-07-29 ENCOUNTER — Encounter: Payer: Self-pay | Admitting: Family Medicine

## 2021-07-29 ENCOUNTER — Ambulatory Visit (INDEPENDENT_AMBULATORY_CARE_PROVIDER_SITE_OTHER): Payer: 59 | Admitting: Family Medicine

## 2021-07-29 VITALS — BP 122/70 | HR 70 | Temp 97.5°F | Resp 18 | Ht 64.0 in | Wt 160.6 lb

## 2021-07-29 DIAGNOSIS — Z124 Encounter for screening for malignant neoplasm of cervix: Secondary | ICD-10-CM

## 2021-07-29 DIAGNOSIS — Z13220 Encounter for screening for lipoid disorders: Secondary | ICD-10-CM

## 2021-07-29 DIAGNOSIS — Z0000 Encounter for general adult medical examination without abnormal findings: Secondary | ICD-10-CM | POA: Diagnosis not present

## 2021-07-29 DIAGNOSIS — Z23 Encounter for immunization: Secondary | ICD-10-CM

## 2021-07-29 DIAGNOSIS — M25561 Pain in right knee: Secondary | ICD-10-CM | POA: Insufficient documentation

## 2021-07-29 DIAGNOSIS — F419 Anxiety disorder, unspecified: Secondary | ICD-10-CM

## 2021-07-29 DIAGNOSIS — Z131 Encounter for screening for diabetes mellitus: Secondary | ICD-10-CM | POA: Diagnosis not present

## 2021-07-29 DIAGNOSIS — F514 Sleep terrors [night terrors]: Secondary | ICD-10-CM

## 2021-07-29 DIAGNOSIS — G8929 Other chronic pain: Secondary | ICD-10-CM

## 2021-07-29 DIAGNOSIS — I10 Essential (primary) hypertension: Secondary | ICD-10-CM | POA: Diagnosis not present

## 2021-07-29 DIAGNOSIS — R5383 Other fatigue: Secondary | ICD-10-CM | POA: Diagnosis not present

## 2021-07-29 DIAGNOSIS — R7303 Prediabetes: Secondary | ICD-10-CM | POA: Insufficient documentation

## 2021-07-29 DIAGNOSIS — F32A Depression, unspecified: Secondary | ICD-10-CM

## 2021-07-29 LAB — COMPREHENSIVE METABOLIC PANEL
ALT: 14 U/L (ref 0–35)
AST: 15 U/L (ref 0–37)
Albumin: 4.3 g/dL (ref 3.5–5.2)
Alkaline Phosphatase: 51 U/L (ref 39–117)
BUN: 21 mg/dL (ref 6–23)
CO2: 33 mEq/L — ABNORMAL HIGH (ref 19–32)
Calcium: 9.1 mg/dL (ref 8.4–10.5)
Chloride: 103 mEq/L (ref 96–112)
Creatinine, Ser: 0.75 mg/dL (ref 0.40–1.20)
GFR: 84.3 mL/min (ref >60.00)
Glucose, Bld: 91 mg/dL (ref 70–99)
Potassium: 3.8 mEq/L (ref 3.5–5.1)
Sodium: 142 mEq/L (ref 135–145)
Total Bilirubin: 0.5 mg/dL (ref 0.2–1.2)
Total Protein: 7 g/dL (ref 6.0–8.3)

## 2021-07-29 LAB — CBC
HCT: 39.1 % (ref 36.0–46.0)
Hemoglobin: 12.9 g/dL (ref 12.0–15.0)
MCHC: 33 g/dL (ref 30.0–36.0)
MCV: 90.8 fl (ref 78.0–100.0)
Platelets: 256 10*3/uL (ref 150.0–400.0)
RBC: 4.31 Mil/uL (ref 3.87–5.11)
RDW: 13.7 % (ref 11.5–15.5)
WBC: 9.9 10*3/uL (ref 4.0–10.5)

## 2021-07-29 LAB — LIPID PANEL
Cholesterol: 128 mg/dL (ref 0–200)
HDL: 61.9 mg/dL (ref >39.00)
LDL Cholesterol: 43 mg/dL (ref 0–99)
NonHDL: 66
Total CHOL/HDL Ratio: 2
Triglycerides: 115 mg/dL (ref 0.0–149.0)
VLDL: 23 mg/dL (ref 0.0–40.0)

## 2021-07-29 LAB — HEMOGLOBIN A1C: Hgb A1c MFr Bld: 6.4 % (ref 4.6–6.5)

## 2021-07-29 LAB — TSH: TSH: 0.68 u[IU]/mL (ref 0.35–5.50)

## 2021-07-29 LAB — VITAMIN D 25 HYDROXY (VIT D DEFICIENCY, FRACTURES): VITD: 33.65 ng/mL (ref 30.00–100.00)

## 2021-07-29 MED ORDER — ALPRAZOLAM 0.5 MG PO TABS: 0.2500 mg | ORAL_TABLET | Freq: Every evening | ORAL | 3 refills | Status: DC | PRN

## 2021-07-30 ENCOUNTER — Encounter: Payer: Self-pay | Admitting: Family Medicine

## 2021-07-31 ENCOUNTER — Encounter: Payer: Self-pay | Admitting: Family Medicine

## 2021-07-31 DIAGNOSIS — G8929 Other chronic pain: Secondary | ICD-10-CM

## 2021-07-31 LAB — CYTOLOGY - PAP
Comment: NEGATIVE
Diagnosis: NEGATIVE
High risk HPV: NEGATIVE

## 2021-08-14 MED ORDER — PREDNISONE 10 MG PO TABS: 10.0000 mg | ORAL_TABLET | Freq: Every day | ORAL | 2 refills | Status: DC

## 2021-08-14 MED ORDER — PREDNISONE 50 MG PO TABS: ORAL_TABLET | ORAL | 0 refills | Status: DC

## 2021-09-24 IMAGING — DX DG CHEST 2V
2 series · 2 of 2 positions shown · non-contrast
Comparison: None.

CLINICAL DATA: Chronic shortness of breath.

EXAM:
CHEST - 2 VIEW

[chest pa]
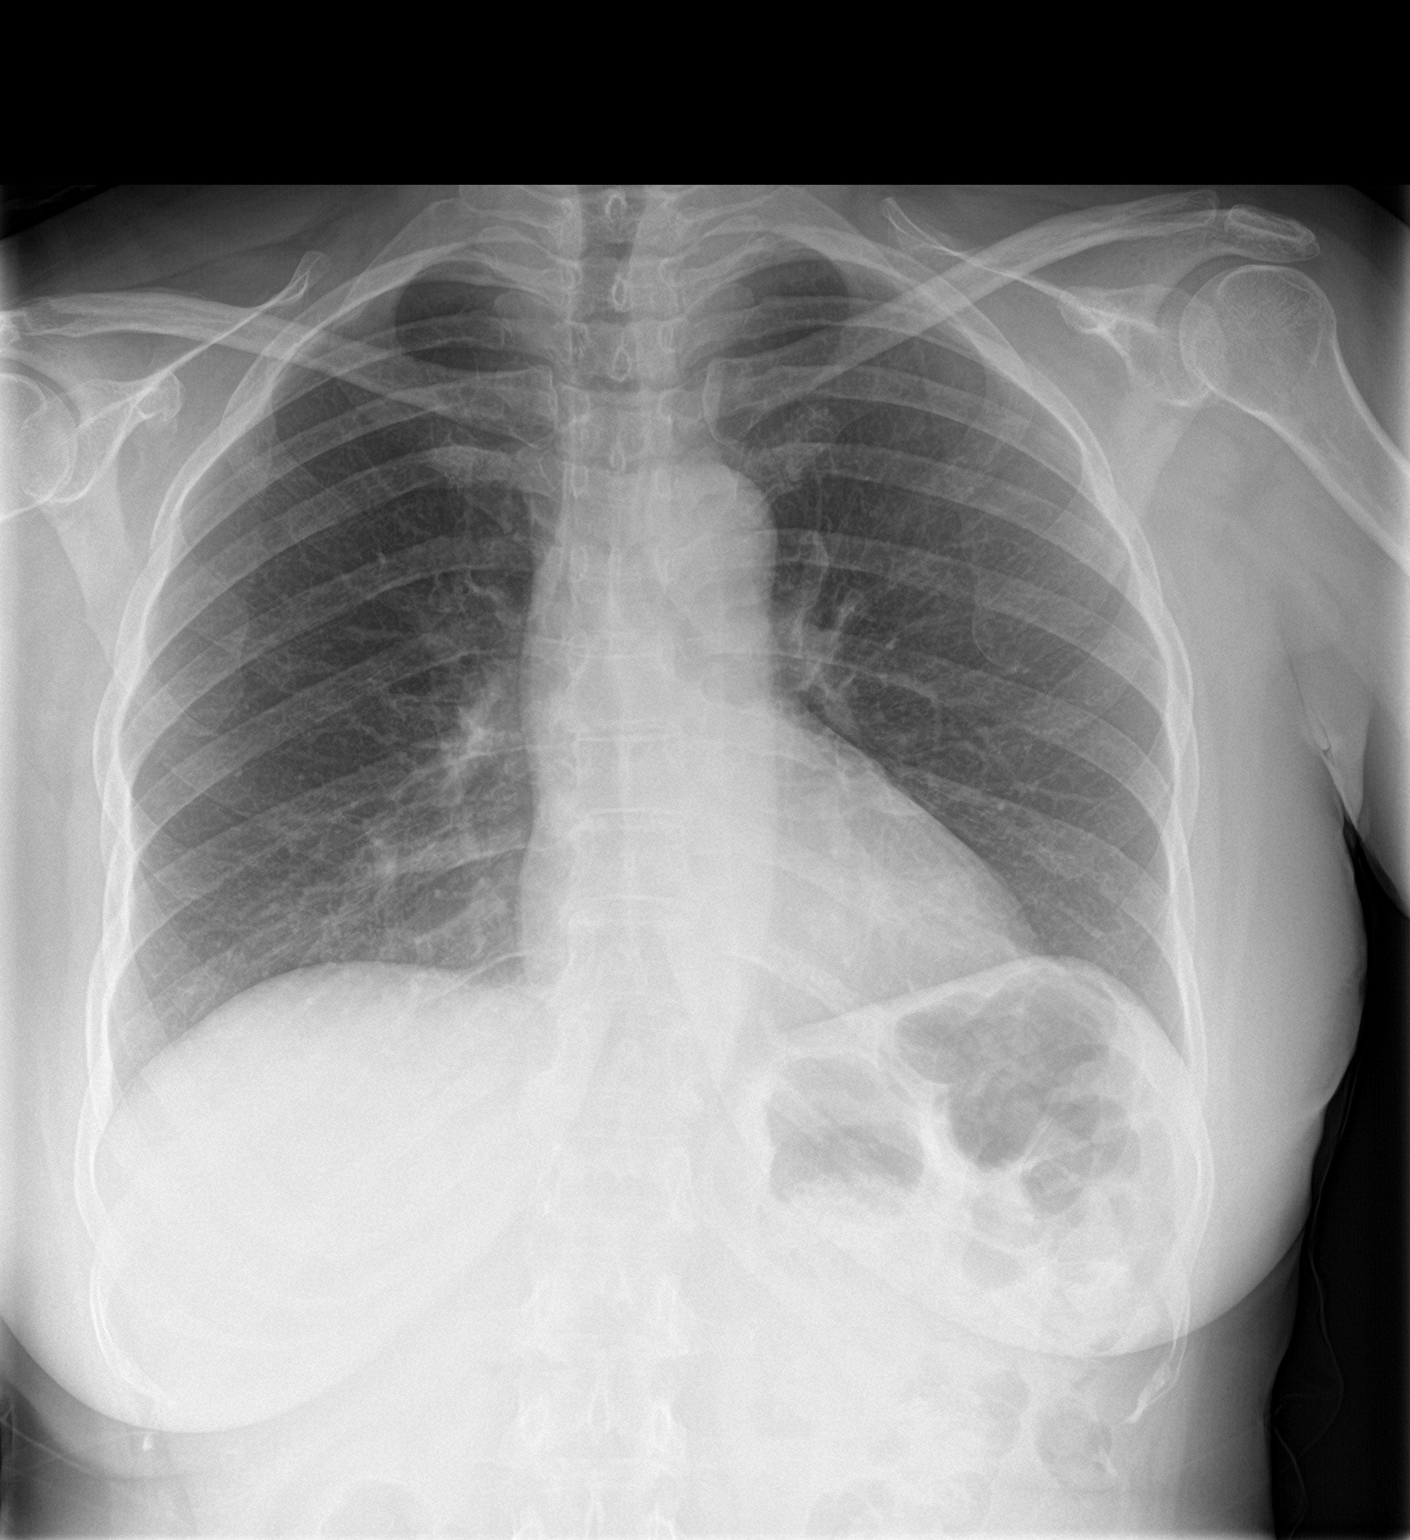

[chest lat]
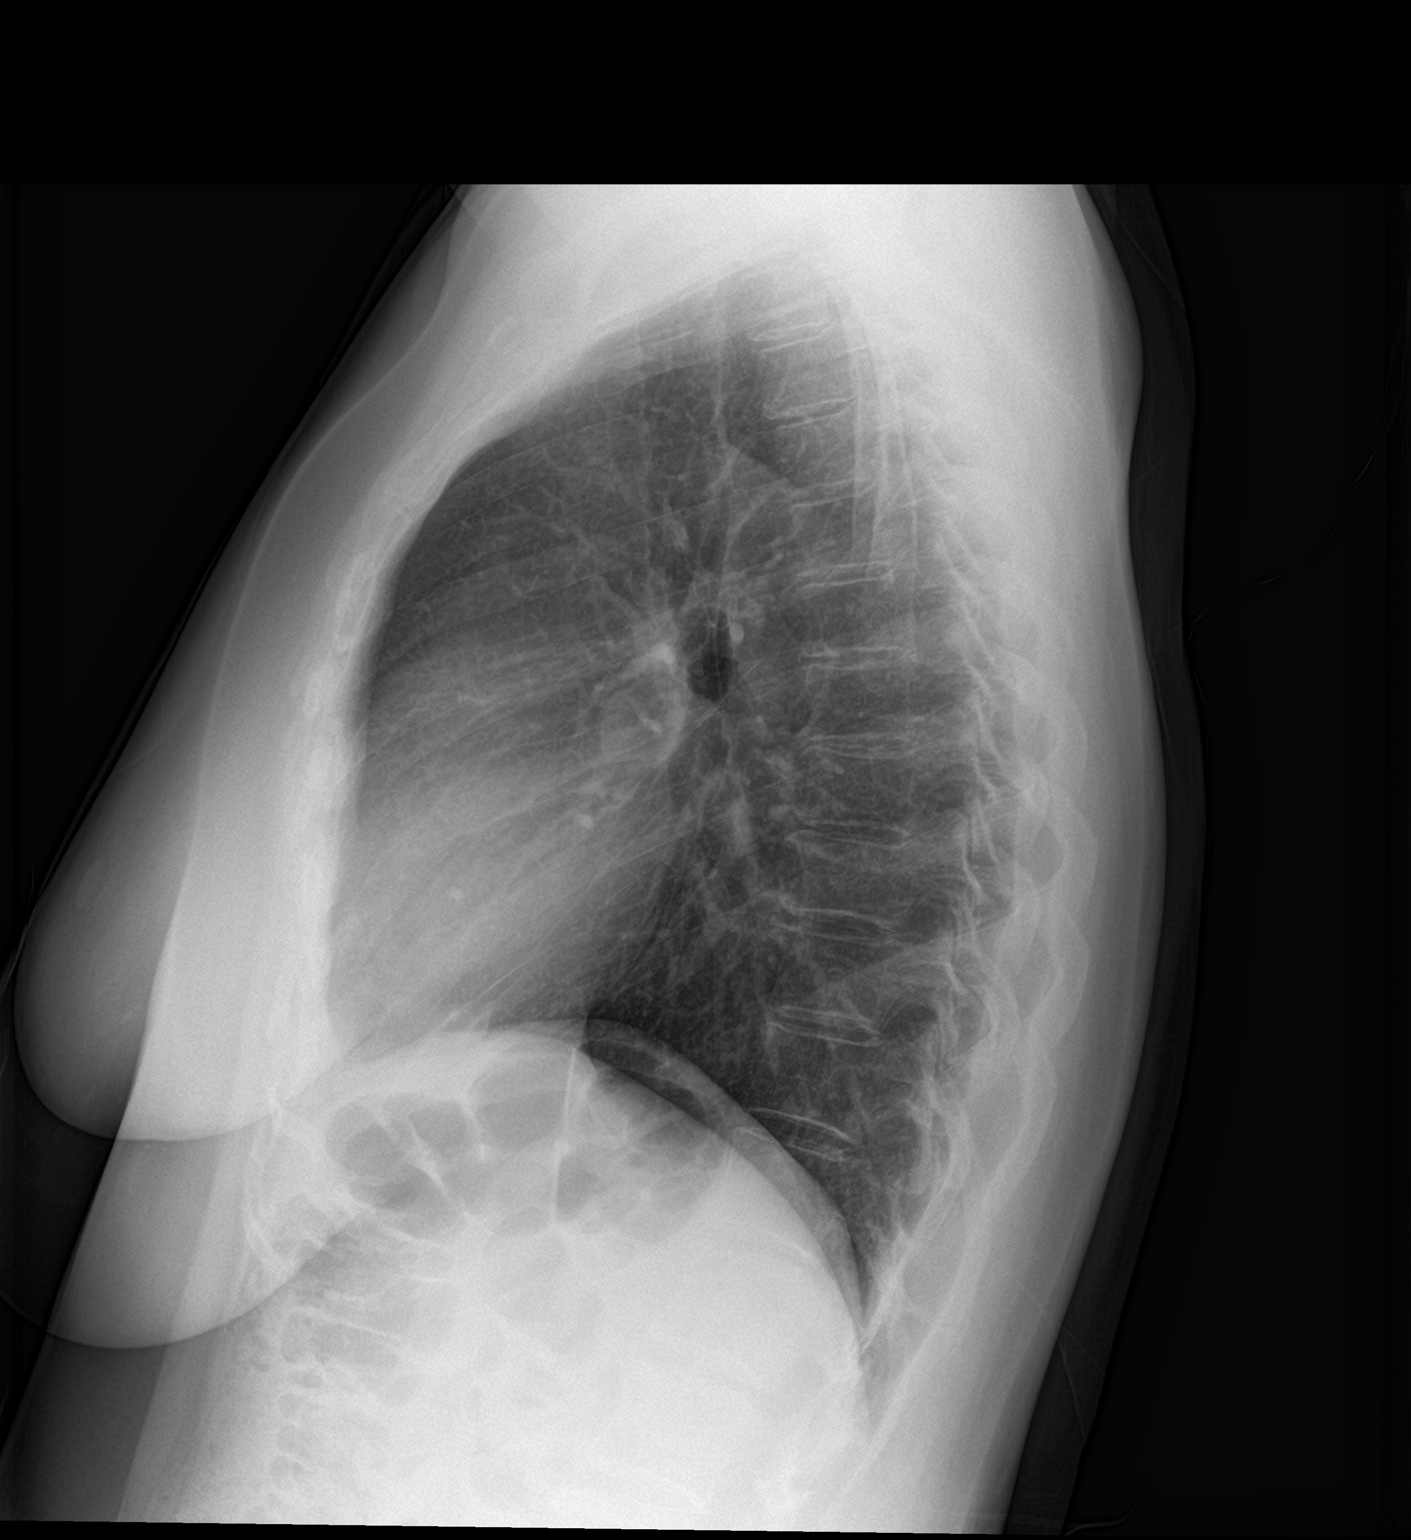

[2 of 2 positions shown; findings below may reference images not displayed]

FINDINGS: The heart size and mediastinal contours are within normal limits. No
focal consolidation. No pleural effusion or pneumothorax. The
visualized skeletal structures are unremarkable.
IMPRESSION: No active cardiopulmonary disease.

## 2021-10-08 ENCOUNTER — Encounter: Payer: Self-pay | Admitting: Family Medicine

## 2021-10-08 DIAGNOSIS — N8500 Endometrial hyperplasia, unspecified: Secondary | ICD-10-CM

## 2021-10-11 ENCOUNTER — Other Ambulatory Visit: Payer: Self-pay | Admitting: Family

## 2021-10-11 DIAGNOSIS — G8929 Other chronic pain: Secondary | ICD-10-CM

## 2021-10-11 DIAGNOSIS — M5441 Lumbago with sciatica, right side: Secondary | ICD-10-CM

## 2021-10-15 NOTE — Progress Notes (Signed)
[facility] [address], [address] [place], [place] [address] [phone]  Date:  10/16/2021   Name:  Amber Nichols   DOB:  1956/02/08   MRN:  [id number]  PCP:  Pearline Cables, MD    Chief Complaint: kidney check   History of Present Illness:  Amber Nichols is a 66 y.o. very pleasant female patient who presents with the following:  Patient seen today for follow-up and lab work Most recent visit with myself was in November for physical History of hypertension, she has cardiology care  Patient requires an interpreter due to language barrier, she prefers to have her daughter serve in this role-her daughter is with her today  At her last visit I did refill alprazolam which she has been taking for many years at bedtime  Her lab work back in November was generally favorable-renal function normal A1c was in the prediabetes range but trending upward  She recently had a CT scan done in [place] ; scan dated 04/2021; there is concern concern of endometrial thickening and the radiologist recommended GYN follow-up.  I also spoke with her daughter about this directly at her daughter's recent appointment They would like to obtain a pelvic ultrasound to get more information, and also proceed with GYN referral  Also she was told he had a fatty liver on previous ultrasound-they would like to repeat this test in the [place] for confirmation  Pt has concern or RUQ pain for about month - does not follow any particular pattern  Not worse after eating No nausea or vomiting   She is taking metformin- from [place]- I was not aware she was taking this It sounds as though she has diagnosis of prediabetes She would like it refilled which is fine Lab Results  Component Value Date   HGBA1C 6.4 07/29/2021      Patient Active Problem List   Diagnosis Date Noted   Prediabetes 07/29/2021   Precordial chest pain 10/03/2020   Dyspnea on exertion  10/03/2020   Night terror    Hypertension    COVID-19 virus infection 2020    Past Medical History:  Diagnosis Date   COVID-19 virus infection 2020   Hypertension    Night terror     Past Surgical History:  Procedure Laterality Date   COLONOSCOPY WITH PROPOFOL N/A 02/10/2020   Procedure: COLONOSCOPY WITH PROPOFOL;  Surgeon: Wyline Mood, MD;  Location: [facility] ENDOSCOPY;  Service: Gastroenterology;  Laterality: N/A;  C-19 POSITIVE ON 01/13/20   DILATION AND CURETTAGE OF UTERUS     ESOPHAGOGASTRODUODENOSCOPY (EGD) WITH PROPOFOL N/A 02/10/2020   Procedure: ESOPHAGOGASTRODUODENOSCOPY (EGD) WITH PROPOFOL;  Surgeon: Wyline Mood, MD;  Location: [facility] ENDOSCOPY;  Service: Gastroenterology;  Laterality: N/A;   HEMORRHOID SURGERY      Social History   Tobacco Use   Smoking status: Never   Smokeless tobacco: Never  Vaping Use   Vaping Use: Never used  Substance Use Topics   Alcohol use: Never   Drug use: Never    Family History  Problem Relation Age of Onset   Breast cancer Sister    Colon cancer Neg Hx    Esophageal cancer Neg Hx     No Known Allergies  Medication list has been reviewed and updated.  Current Outpatient Medications on File Prior to Visit  Medication Sig Dispense Refill   ALPRAZolam (XANAX) 0.5 MG tablet Take 0.5 tablets (0.25 mg total) by mouth at bedtime as needed  for anxiety. 45 tablet 3   gabapentin (NEURONTIN) 100 MG capsule Take 1 capsule (100 mg total) by mouth at bedtime. 90 capsule 1   metoprolol succinate (TOPROL-XL) 50 MG 24 hr tablet Take 1 tablet (50 mg total) by mouth daily. Take with or immediately following a meal. (Patient taking differently: Take 50 mg by mouth daily. Taking 1/2 nightly.) 90 tablet 3   predniSONE (DELTASONE) 10 MG tablet Take 1 tablet (10 mg total) by mouth daily with breakfast. 30 tablet 2   predniSONE (DELTASONE) 50 MG tablet Take one tablet by mouth once daily for 5 days. 5 tablet 0   No current facility-administered  medications on file prior to visit.    Review of Systems:  As per HPI- otherwise negative.   Physical Examination: Vitals:   10/16/21 1600  BP: 112/60  Pulse: 73  Resp: 18  Temp: 98 F (36.7 C)  SpO2: 98%   Vitals:   10/16/21 1600  Weight: 157 lb 3.2 oz (71.3 kg)  Height: 5\' 4"  (1.626 m)   Body mass index is 26.98 kg/m. Ideal Body Weight: Weight in (lb) to have BMI = 25: 145.3  GEN: no acute distress.  Mildly overweight, looks well HEENT: Atraumatic, Normocephalic.  Ears and Nose: No external deformity. CV: RRR, No M/G/R. No JVD. No thrill. No extra heart sounds. PULM: CTA B, no wheezes, crackles, rhonchi. No retractions. No resp. distress. No accessory muscle use. ABD: S, NT, ND, +BS. No rebound. No HSM.  Belly is benign EXTR: No c/c/e PSYCH: Normally interactive. Conversant.  Patient indicates tenderness in her right side, actually over the right inferior ribs   Assessment and Plan: Rib pain on right side - Plan: DG Ribs Unilateral W/Chest Right  Endometrial thickening on ultrasound - Plan: Ambulatory referral to Obstetrics / Gynecology, Pelvic Complete With Transvaginal  Prediabetes - Plan: metFORMIN (GLUCOPHAGE) 500 MG tablet  RUQ pain - Plan: US Abdomen Limited RUQ (LIVER/GB)  Dyslipidemia - Plan: atorvastatin (LIPITOR) 10 MG tablet  Hypertension, unspecified type - Plan: metoprolol succinate (TOPROL-XL) 50 MG 24 hr tablet  Patient seen today with a couple concerns.  She had complained of right upper quadrant pain, but on exam she seems to actually have rib tenderness.  Will obtain plain films and chest film today She also endorses history of possible fatty liver-we will obtain a right upper quadrant ultrasound to check on this and rule out gallstones Refilled metformin and metoprolol for hypertension Patient is prescribed 50 mg, she states she will take a second pill about 3 times a week if her blood pressure is elevated-for example 150/90 Finally,  referral to GYN and will set up ultrasound to evaluate endometrial hyperplasia noted on CT scan done in [place].  The dating system used in that country is different, patient's daughter states that the CT was done in August 2022  Signed September 2022, MD

## 2021-10-16 ENCOUNTER — Ambulatory Visit (INDEPENDENT_AMBULATORY_CARE_PROVIDER_SITE_OTHER): Payer: 59 | Admitting: Family Medicine

## 2021-10-16 VITALS — BP 112/60 | HR 73 | Temp 98.0°F | Resp 18 | Ht 64.0 in | Wt 157.2 lb

## 2021-10-16 DIAGNOSIS — R7303 Prediabetes: Secondary | ICD-10-CM | POA: Diagnosis not present

## 2021-10-16 DIAGNOSIS — E785 Hyperlipidemia, unspecified: Secondary | ICD-10-CM

## 2021-10-16 DIAGNOSIS — R9389 Abnormal findings on diagnostic imaging of other specified body structures: Secondary | ICD-10-CM | POA: Diagnosis not present

## 2021-10-16 DIAGNOSIS — R1011 Right upper quadrant pain: Secondary | ICD-10-CM

## 2021-10-16 DIAGNOSIS — I10 Essential (primary) hypertension: Secondary | ICD-10-CM

## 2021-10-16 DIAGNOSIS — R0781 Pleurodynia: Secondary | ICD-10-CM

## 2021-10-16 MED ORDER — METFORMIN HCL 500 MG PO TABS: 500.0000 mg | ORAL_TABLET | Freq: Two times a day (BID) | ORAL | 3 refills | Status: DC

## 2021-10-16 MED ORDER — ATORVASTATIN CALCIUM 10 MG PO TABS: 10.0000 mg | ORAL_TABLET | Freq: Every day | ORAL | 3 refills | Status: DC

## 2021-10-16 MED ORDER — METOPROLOL SUCCINATE ER 50 MG PO TB24: 50.0000 mg | ORAL_TABLET | Freq: Every day | ORAL | 3 refills | Status: DC

## 2021-10-16 NOTE — Patient Instructions (Signed)
Please go to imaging on the ground floor to have x-rays and also schedule your ultrasound reports I will put in a referral to have you seen by GYN for your uterine concern Medications are refilled  Please see me in about 4 months for labs and to check in

## 2021-10-17 ENCOUNTER — Other Ambulatory Visit (HOSPITAL_BASED_OUTPATIENT_CLINIC_OR_DEPARTMENT_OTHER): Payer: 59

## 2021-10-17 ENCOUNTER — Other Ambulatory Visit (HOSPITAL_COMMUNITY): Payer: 59

## 2021-10-22 ENCOUNTER — Ambulatory Visit (HOSPITAL_BASED_OUTPATIENT_CLINIC_OR_DEPARTMENT_OTHER)
Admission: RE | Admit: 2021-10-22 | Discharge: 2021-10-22 | Disposition: A | Discharge status: Home / Self Care | Payer: 59 | Source: Ambulatory Visit | Attending: Family Medicine | Admitting: Family Medicine

## 2021-10-22 ENCOUNTER — Encounter: Payer: Self-pay | Admitting: Family Medicine

## 2021-10-22 ENCOUNTER — Other Ambulatory Visit: Payer: Self-pay

## 2021-10-22 ENCOUNTER — Ambulatory Visit (HOSPITAL_BASED_OUTPATIENT_CLINIC_OR_DEPARTMENT_OTHER): Payer: 59

## 2021-10-22 ENCOUNTER — Telehealth: Payer: Self-pay | Admitting: Family Medicine

## 2021-10-22 DIAGNOSIS — R1011 Right upper quadrant pain: Secondary | ICD-10-CM | POA: Insufficient documentation

## 2021-10-22 DIAGNOSIS — R9389 Abnormal findings on diagnostic imaging of other specified body structures: Secondary | ICD-10-CM | POA: Diagnosis present

## 2021-10-22 NOTE — Telephone Encounter (Signed)
Received her [place] report and called to make sure she has a GYN appt set up- appt with [facility] on 2/21 at 2:45  US Pelvic Complete With Transvaginal  Result Date: 10/22/2021 CLINICAL DATA:  Abnormal CT exam performed out of country demonstrate thickened endometrial complex EXAM: TRANSABDOMINAL AND TRANSVAGINAL ULTRASOUND OF PELVIS TECHNIQUE: Both transabdominal and transvaginal ultrasound examinations of the pelvis were performed. Transabdominal technique was performed for global imaging of the pelvis including uterus, ovaries, adnexal regions, and pelvic cul-de-sac. It was necessary to proceed with endovaginal exam following the transabdominal exam to visualize the uterus, endometrium, and ovaries. COMPARISON:  None; correlation remote CT 09/03/2020 FINDINGS: Uterus Measurements: 6.5 x 4.2 x 5.1 cm = volume: 73 mL. Retroverted. Heterogeneous myometrium. Subserosal leiomyomata at anterior upper uterus measuring up to 17 mm and 15 mm in size. No additional masses. Endometrium Thickness: 20 mm. Abnormal thickened, containing numerous cystic foci of uncertain etiology but appear to be present on 2021 CT. Largest cystic component is 12 mm diameter. This could represent cystic degeneration of the myometrium though endometrial neoplasm is not excluded. Right ovary Not visualized, likely obscured by bowel Left ovary Not visualized, likely obscured by bowel Other findings No free pelvic fluid.  No adnexal masses. IMPRESSION: 2 small subserosal leiomyomata at anterior upper uterus. Nonvisualization of ovaries. Multiple cystic foci distend the endometrial canal at the upper uterine segment, well-defined, individual cysts up to 12 mm diameter. This may represent cystic degeneration of the endometrium though endometrial cancer is not excluded. Tissue diagnosis recommended. These results will be called to the ordering clinician or representative by the Radiologist Assistant, and communication documented in the PACS or  [employer]. Electronically Signed   By: Ulyses Southward M.D.   On: 10/22/2021 15:43    Called and LMOM with her daughter and will also mychart this info to them

## 2021-10-23 ENCOUNTER — Telehealth: Payer: Self-pay | Admitting: Family Medicine

## 2021-10-23 DIAGNOSIS — K8020 Calculus of gallbladder without cholecystitis without obstruction: Secondary | ICD-10-CM

## 2021-10-23 NOTE — Telephone Encounter (Signed)
Received right upper quadrant ultrasound report  US Pelvic Complete With Transvaginal  Result Date: 10/22/2021 CLINICAL DATA:  Abnormal CT exam performed out of country demonstrate thickened endometrial complex EXAM: TRANSABDOMINAL AND TRANSVAGINAL ULTRASOUND OF PELVIS TECHNIQUE: Both transabdominal and transvaginal ultrasound examinations of the pelvis were performed. Transabdominal technique was performed for global imaging of the pelvis including uterus, ovaries, adnexal regions, and pelvic cul-de-sac. It was necessary to proceed with endovaginal exam following the transabdominal exam to visualize the uterus, endometrium, and ovaries. COMPARISON:  None; correlation remote CT 09/03/2020 FINDINGS: Uterus Measurements: 6.5 x 4.2 x 5.1 cm = volume: 73 mL. Retroverted. Heterogeneous myometrium. Subserosal leiomyomata at anterior upper uterus measuring up to 17 mm and 15 mm in size. No additional masses. Endometrium Thickness: 20 mm. Abnormal thickened, containing numerous cystic foci of uncertain etiology but appear to be present on 2021 CT. Largest cystic component is 12 mm diameter. This could represent cystic degeneration of the myometrium though endometrial neoplasm is not excluded. Right ovary Not visualized, likely obscured by bowel Left ovary Not visualized, likely obscured by bowel Other findings No free pelvic fluid.  No adnexal masses. IMPRESSION: 2 small subserosal leiomyomata at anterior upper uterus. Nonvisualization of ovaries. Multiple cystic foci distend the endometrial canal at the upper uterine segment, well-defined, individual cysts up to 12 mm diameter. This may represent cystic degeneration of the endometrium though endometrial cancer is not excluded. Tissue diagnosis recommended. These results will be called to the ordering clinician or representative by the Radiologist Assistant, and communication documented in the PACS or [employer]. Electronically Signed   By: Ulyses Southward M.D.   On:  10/22/2021 15:43   US Abdomen Limited RUQ (LIVER/GB)  Result Date: 10/22/2021 CLINICAL DATA:  Right upper quadrant abdominal pain EXAM: ULTRASOUND ABDOMEN LIMITED RIGHT UPPER QUADRANT COMPARISON:  None. FINDINGS: Gallbladder: Multiple layering gallstones are seen within the gallbladder. The gallbladder, however, is not distended, there is no gallbladder wall thickening, and no pericholecystic fluid is identified. The sonographic Eulah Pont sign is reportedly negative. Common bile duct: Diameter: 2 mm in proximal diameter Liver: A 10 mm homogeneously hyperechoic solid mass is seen within the right hepatic lobe most compatible with a benign cavernous hemangioma in a patient without a history of malignancy. No additional intrahepatic masses are identified. No intrahepatic biliary ductal dilation. Hepatic parenchymal echogenicity is within normal limits. Portal vein is patent on color Doppler imaging with normal direction of blood flow towards the liver. Other: None. IMPRESSION: Cholelithiasis without sonographic evidence of acute cholecystitis. 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 Electronically Signed   By: Helyn Numbers M.D.   On: 10/22/2021 22:44

## 2021-10-31 ENCOUNTER — Encounter: Payer: Self-pay | Admitting: *Deleted

## 2021-12-05 ENCOUNTER — Ambulatory Visit (HOSPITAL_BASED_OUTPATIENT_CLINIC_OR_DEPARTMENT_OTHER): Payer: 59 | Admitting: Obstetrics & Gynecology

## 2021-12-20 ENCOUNTER — Encounter: Payer: Self-pay | Admitting: Family Medicine

## 2021-12-20 DIAGNOSIS — M25561 Pain in right knee: Secondary | ICD-10-CM

## 2021-12-21 ENCOUNTER — Other Ambulatory Visit: Payer: Self-pay | Admitting: Family

## 2021-12-21 ENCOUNTER — Other Ambulatory Visit: Payer: Self-pay | Admitting: Family Medicine

## 2022-01-01 NOTE — Progress Notes (Signed)
? ? Amber Nichols D.Judd Gaudier ?[facility] ?[address] [place] [address] ?Phone: [phone] ?  ?Assessment and Plan:   ?  ?1. Chronic pain of right knee ?2. Primary osteoarthritis of right knee ?-Chronic with exacerbation, initial sports medicine visit ?- Consistent with acute flare of knee osteoarthritis based on HPI, physical exam, prior imaging ?- We will start with conservative therapy to include course of NSAIDs and exercises and if no improvement in 3 weeks would consider knee aspiration/CSI ? - Start meloxicam 15 mg daily x2 weeks.  If still having pain after 2 weeks, complete 3rd-week of meloxicam. May use remaining meloxicam as needed once daily for pain control.  Do not to use additional NSAIDs while taking meloxicam.  May use Tylenol [phone] mg 2 to 3 times a day for breakthrough pain.  ?-Patient's daughter says that patient was given prednisone 10 mg to take as needed for pain relief.  I advised that she discontinue prednisone ? ?Patient accompanied by her daughter throughout clinic visit.  Patient's daughter helps translate throughout the visit.  She believes that last CSI to right knee was in 08/2021 ? ?Pertinent previous records reviewed include knee x-ray 07/29/2021, PCP note 12/20/21, Ortho note 08/22/2019 ?  ?Follow Up:  We will start with conservative therapy to include course of NSAIDs and exercises and if no improvement in 3 weeks would consider knee aspiration/CSI  ? ?  ?Subjective:   ?I, Jerene Canny, am serving as a Neurosurgeon for Doctor [employer] ? ?Chief Complaint: right knee pain  ? ?HPI:  ?01/02/2022 ?Patient is a 66 year old female complaining of right knee pain. Patient states that has been painful for about 10 months, has a CSI  helped a little but didn't take away all of her pain , recently 2 months her knee is numb the leg doesn't feel like hers she almost falls because the drags the leg/foot , had depo shot in back since last month her knee gave  out , knee is swollen,  pain radiates up to the hip and down the leg, before she got CSI the pain was in the back and into the calf, now the pain is in the front all the way up to the knee , wants to know if the back pain is causing her knee pain ? ?Relevant Historical Information: htn ? ?Additional pertinent review of systems negative. ? ? ?Current Outpatient Medications:  ?  ALPRAZolam (XANAX) 0.5 MG tablet, Take 0.5 tablets (0.25 mg total) by mouth at bedtime as needed for anxiety., Disp: 45 tablet, Rfl: 3 ?  atorvastatin (LIPITOR) 10 MG tablet, Take 1 tablet (10 mg total) by mouth daily., Disp: 90 tablet, Rfl: 3 ?  FLUoxetine (PROZAC) 20 MG capsule, TAKE 1 CAPSULE(20 MG) BY MOUTH DAILY, Disp: 30 capsule, Rfl: 6 ?  gabapentin (NEURONTIN) 100 MG capsule, TAKE 1 CAPSULE(100 MG) BY MOUTH AT BEDTIME, Disp: 90 capsule, Rfl: 1 ?  meloxicam (MOBIC) 15 MG tablet, Take 1 tablet (15 mg total) by mouth daily., Disp: 30 tablet, Rfl: 0 ?  metFORMIN (GLUCOPHAGE) 500 MG tablet, Take 1 tablet (500 mg total) by mouth 2 (two) times daily with a meal., Disp: 180 tablet, Rfl: 3 ?  metoprolol succinate (TOPROL-XL) 50 MG 24 hr tablet, Take 1 tablet (50 mg total) by mouth daily. May take 2 tablets if BP is elevated. Take with or immediately following a meal., Disp: 145 tablet, Rfl: 3 ?  predniSONE (DELTASONE) 10 MG tablet, Take 1  tablet (10 mg total) by mouth daily with breakfast., Disp: 30 tablet, Rfl: 2  ? ?Objective:   ?  ?Vitals:  ? 01/02/22 1302  ?BP: 132/80  ?Pulse: 72  ?SpO2: 98%  ?Height: 5\' 4"  (1.626 m)  ?  ?  ?Body mass index is 26.98 kg/m?.  ?  ?Physical Exam:   ? ?General:  awake, alert oriented, no acute distress nontoxic ?Skin: no suspicious lesions or rashes ?Neuro:sensation intact, no deficits, strength 5/5 with no deficits, no atrophy, normal muscle tone ?Psych: No signs of anxiety, depression or other mood disorder ? ?Right knee: ?Moderate swelling ?No deformity ?Positive fluid wave, joint milking ?ROM Flex 90, Ext  10 ?TTP medial joint line, medial femoral condyle, posterior fossa ?NTTP over the quad tendon, lat fem condyle, patella, plica, patella tendon, tibial tuberostiy, fibular head,  pes anserine bursa, gerdy's tubercle,lateral jt line ?Neg anterior and posterior drawer ?Neg lachman ?Neg sag sign ?Negative varus stress ?Negative valgus stress ?Equivocal [place] ?  ? ?Gait abnormal.  Favoring left leg and using wheelchair due to current pain level ? ? ?Electronically signed by:  ? D.Amber Nichols ?[facility] ?2:06 PM 01/02/22 ?

## 2022-01-02 ENCOUNTER — Ambulatory Visit (INDEPENDENT_AMBULATORY_CARE_PROVIDER_SITE_OTHER): Payer: 59 | Admitting: Sports Medicine

## 2022-01-02 ENCOUNTER — Other Ambulatory Visit: Payer: Self-pay | Admitting: Sports Medicine

## 2022-01-02 VITALS — BP 132/80 | HR 72 | Ht 64.0 in

## 2022-01-02 DIAGNOSIS — G8929 Other chronic pain: Secondary | ICD-10-CM | POA: Diagnosis not present

## 2022-01-02 DIAGNOSIS — M25561 Pain in right knee: Secondary | ICD-10-CM

## 2022-01-02 DIAGNOSIS — M1711 Unilateral primary osteoarthritis, right knee: Secondary | ICD-10-CM

## 2022-01-02 MED ORDER — MELOXICAM 15 MG PO TABS: 15.0000 mg | ORAL_TABLET | Freq: Every day | ORAL | 0 refills | Status: DC

## 2022-01-02 NOTE — Patient Instructions (Addendum)
Good to see you  ?Start meloxicam 15 mg daily x 3 weeks. May use remaining meloxicam as needed once daily for pain control.  Do not to use additional NSAIDs while taking meloxicam.  May use Tylenol [phone] mg 2 to 3 times a day for breakthrough pain. ?Knee HEP  ?3 week follow up  ? ? ?

## 2022-01-20 NOTE — Progress Notes (Signed)
? ? Aleen Sells D.Judd Gaudier ?[facility] ?[address] [place] [address] ?Phone: [phone] ?  ?Assessment and Plan:   ?  ?1. Chronic pain of right knee ?2. Primary osteoarthritis of right knee ?-Chronic with exacerbation, subsequent visit ?- Significant improvement in acute flare of chronic knee pain with underlying osteoarthritis of the knee after completion of 3-week course of meloxicam ?- Discontinue daily meloxicam and may use remainder as needed.  Additional one-time refill of meloxicam provided ?- Start Tylenol 500 to 1000 mg tablets 2-3 times a day as needed for day-to-day pain relief ?- Start HEP for knee ? ?3. Chronic bilateral thoracic back pain ?4. Chronic bilateral low back pain without sciatica ?-Chronic with exacerbation, initial visit ?- Intermittent back pain present for years and worse with physical activity without red flag symptoms ?- Start Tylenol 500 to 1000 mg tablets 2-3 times a day for day-to-day pain relief ?- Start HEP for back ? ?Pertinent previous records reviewed include none ?  ?Follow Up: 3 to 4 weeks for reevaluation of back pain.  If no improvement or worsening of symptoms would obtain thoracic and lumbar x-ray.  If knee pain recurs could consider repeat NSAID course since this was beneficial for patient versus intra-articular knee CSI ?  ?Subjective:   ?I, Jerene Canny, am serving as a Neurosurgeon for Doctor [employer] ?  ?Chief Complaint: right knee pain  ?  ?HPI:  ?01/02/2022 ?Patient is a 66 year old female complaining of right knee pain. Patient states that has been painful for about 10 months, has a CSI  helped a little but didn't take away all of her pain , recently 2 months her knee is numb the leg doesn't feel like hers she almost falls because the drags the leg/foot , had depo shot in back since last month her knee gave out , knee is swollen,  pain radiates up to the hip and down the leg, before she got CSI the pain was in the back and  into the calf, now the pain is in the front all the way up to the knee , wants to know if the back pain is causing her knee pain ? ?01/21/2022 ?Patient states that her knee is better  ? ? ?Relevant Historical Information: htn ? ?Additional pertinent review of systems negative. ? ? ?Current Outpatient Medications:  ?  ALPRAZolam (XANAX) 0.5 MG tablet, Take 0.5 tablets (0.25 mg total) by mouth at bedtime as needed for anxiety., Disp: 45 tablet, Rfl: 3 ?  atorvastatin (LIPITOR) 10 MG tablet, Take 1 tablet (10 mg total) by mouth daily., Disp: 90 tablet, Rfl: 3 ?  FLUoxetine (PROZAC) 20 MG capsule, TAKE 1 CAPSULE(20 MG) BY MOUTH DAILY, Disp: 30 capsule, Rfl: 6 ?  gabapentin (NEURONTIN) 100 MG capsule, TAKE 1 CAPSULE(100 MG) BY MOUTH AT BEDTIME, Disp: 90 capsule, Rfl: 1 ?  metFORMIN (GLUCOPHAGE) 500 MG tablet, Take 1 tablet (500 mg total) by mouth 2 (two) times daily with a meal., Disp: 180 tablet, Rfl: 3 ?  meloxicam (MOBIC) 15 MG tablet, Take 1 tablet (15 mg total) by mouth daily., Disp: 30 tablet, Rfl: 0 ?  metoprolol succinate (TOPROL-XL) 50 MG 24 hr tablet, Take 1 tablet (50 mg total) by mouth daily. May take 2 tablets if BP is elevated. Take with or immediately following a meal., Disp: 145 tablet, Rfl: 3  ? ?Objective:   ?  ?Vitals:  ? 01/21/22 1541  ?BP: 118/80  ?Pulse: 66  ?SpO2: 97%  ?  Weight: 157 lb (71.2 kg)  ?Height: 5\' 4"  (1.626 m)  ?  ?  ?Body mass index is 26.95 kg/m?.  ?  ?Physical Exam:   ? ?General:  awake, alert oriented, no acute distress nontoxic ?Skin: no suspicious lesions or rashes ?Neuro:sensation intact, no deficits, strength 5/5 with no deficits, no atrophy, normal muscle tone ?Psych: No signs of anxiety, depression or other mood disorder ? ?Knee: ?Trace swelling ?No deformity ?Neg fluid wave, joint milking ?ROM Flex 100, Ext 5 ?NTTP over the quad tendon, medial fem condyle, lat fem condyle, patella, plica, patella tendon, tibial tuberostiy, fibular head, posterior fossa, pes anserine bursa,  gerdy's tubercle, medial jt line, lateral jt line ?Neg anterior and posterior drawer ?Neg lachman ?Neg sag sign ?Negative varus stress ?Negative valgus stress ?  ? ?Gait normal ? ?  ? ?Back - Normal skin, Spine with normal alignment and no deformity.   ?No tenderness to vertebral process palpation.   ?Paraspinous muscles are mildly tender and without spasm ?  ? ?Electronically signed by:  ? D.Aleen Sells ?[facility] ?4:00 PM 01/21/22 ?

## 2022-01-21 ENCOUNTER — Ambulatory Visit (INDEPENDENT_AMBULATORY_CARE_PROVIDER_SITE_OTHER): Payer: 59 | Admitting: Sports Medicine

## 2022-01-21 VITALS — BP 118/80 | HR 66 | Ht 64.0 in | Wt 157.0 lb

## 2022-01-21 DIAGNOSIS — M546 Pain in thoracic spine: Secondary | ICD-10-CM

## 2022-01-21 DIAGNOSIS — M545 Low back pain, unspecified: Secondary | ICD-10-CM | POA: Diagnosis not present

## 2022-01-21 DIAGNOSIS — M1711 Unilateral primary osteoarthritis, right knee: Secondary | ICD-10-CM | POA: Diagnosis not present

## 2022-01-21 DIAGNOSIS — G8929 Other chronic pain: Secondary | ICD-10-CM

## 2022-01-21 DIAGNOSIS — M25561 Pain in right knee: Secondary | ICD-10-CM

## 2022-01-21 MED ORDER — MELOXICAM 15 MG PO TABS: 15.0000 mg | ORAL_TABLET | Freq: Every day | ORAL | 0 refills | Status: DC

## 2022-01-21 NOTE — Patient Instructions (Addendum)
Good to see you  ?Discontinue daily meloxicam use remainder as needed  ?Tylenol [phone] mg 2-3 times a day for pain relief  ?Low back and thoracic HEP  ?3-4 week follow up  ?

## 2022-02-16 ENCOUNTER — Other Ambulatory Visit: Payer: Self-pay | Admitting: Family Medicine

## 2022-02-16 DIAGNOSIS — F514 Sleep terrors [night terrors]: Secondary | ICD-10-CM

## 2022-02-17 ENCOUNTER — Encounter: Payer: Self-pay | Admitting: Family Medicine

## 2022-02-19 NOTE — Progress Notes (Deleted)
    Amber Nichols D.[place] Phone: [phone]   Assessment and Plan:     There are no diagnoses linked to this encounter.  ***   Pertinent previous records reviewed include ***   Follow Up: ***     Subjective:   I, Amber Nichols, am serving as a Education administrator for Doctor Glennon Mac   Chief Complaint: right knee pain    HPI:  01/02/2022 Patient is a 66 year old female complaining of right knee pain. Patient states that has been painful for about 10 months, has a CSI  helped a little but didn't take away all of her pain , recently 2 months her knee is numb the leg doesn't feel like hers she almost falls because the drags the leg/foot , had depo shot in back since last month her knee gave out , knee is swollen,  pain radiates up to the hip and down the leg, before she got CSI the pain was in the back and into the calf, now the pain is in the front all the way up to the knee , wants to know if the back pain is causing her knee pain   01/21/2022 Patient states that her knee is better   02/20/2022 Patient states      Relevant Historical Information: htn  Additional pertinent review of systems negative.   Current Outpatient Medications:    ALPRAZolam (XANAX) 0.5 MG tablet, TAKE 1/2 TABLET(0.25 MG) BY MOUTH AT BEDTIME AS NEEDED FOR ANXIETY, Disp: 45 tablet, Rfl: 0   atorvastatin (LIPITOR) 10 MG tablet, Take 1 tablet (10 mg total) by mouth daily., Disp: 90 tablet, Rfl: 3   FLUoxetine (PROZAC) 20 MG capsule, TAKE 1 CAPSULE(20 MG) BY MOUTH DAILY, Disp: 30 capsule, Rfl: 6   gabapentin (NEURONTIN) 100 MG capsule, TAKE 1 CAPSULE(100 MG) BY MOUTH AT BEDTIME, Disp: 90 capsule, Rfl: 1   meloxicam (MOBIC) 15 MG tablet, Take 1 tablet (15 mg total) by mouth daily., Disp: 30 tablet, Rfl: 0   metFORMIN (GLUCOPHAGE) 500 MG tablet, Take 1 tablet (500 mg total) by mouth 2 (two) times daily with a meal., Disp: 180 tablet, Rfl:  3   metoprolol succinate (TOPROL-XL) 50 MG 24 hr tablet, Take 1 tablet (50 mg total) by mouth daily. May take 2 tablets if BP is elevated. Take with or immediately following a meal., Disp: 145 tablet, Rfl: 3   Objective:     There were no vitals filed for this visit.    There is no height or weight on file to calculate BMI.    Physical Exam:    ***   Electronically signed by:  Amber Nichols D.[facility] 10:55 AM 02/19/22

## 2022-02-20 ENCOUNTER — Ambulatory Visit: Payer: 59 | Admitting: Sports Medicine

## 2022-06-07 ENCOUNTER — Other Ambulatory Visit: Payer: Self-pay | Admitting: Sports Medicine

## 2022-06-22 ENCOUNTER — Other Ambulatory Visit: Payer: Self-pay | Admitting: Family

## 2022-08-06 ENCOUNTER — Other Ambulatory Visit: Payer: Self-pay | Admitting: Family Medicine

## 2022-08-06 MED ORDER — GABAPENTIN 100 MG PO CAPS: ORAL_CAPSULE | ORAL | 1 refills | Status: DC

## 2022-08-06 NOTE — Progress Notes (Signed)
Pts daughter here today- her insurance does not cover gabapentin?  We discussed goodrx, likely med is affordable even cash pay. Gave printed rx

## 2022-08-18 ENCOUNTER — Ambulatory Visit (INDEPENDENT_AMBULATORY_CARE_PROVIDER_SITE_OTHER): Payer: Commercial Managed Care - HMO | Admitting: Family Medicine

## 2022-08-18 ENCOUNTER — Encounter: Payer: Self-pay | Admitting: Family Medicine

## 2022-08-18 VITALS — BP 122/70 | HR 61 | Temp 97.9°F | Resp 18 | Wt 163.4 lb

## 2022-08-18 DIAGNOSIS — Z131 Encounter for screening for diabetes mellitus: Secondary | ICD-10-CM

## 2022-08-18 DIAGNOSIS — I10 Essential (primary) hypertension: Secondary | ICD-10-CM

## 2022-08-18 DIAGNOSIS — Z13220 Encounter for screening for lipoid disorders: Secondary | ICD-10-CM

## 2022-08-18 DIAGNOSIS — R5383 Other fatigue: Secondary | ICD-10-CM

## 2022-08-18 DIAGNOSIS — D539 Nutritional anemia, unspecified: Secondary | ICD-10-CM

## 2022-08-18 DIAGNOSIS — R7303 Prediabetes: Secondary | ICD-10-CM

## 2022-08-18 DIAGNOSIS — Z23 Encounter for immunization: Secondary | ICD-10-CM

## 2022-08-18 DIAGNOSIS — G8929 Other chronic pain: Secondary | ICD-10-CM

## 2022-08-18 DIAGNOSIS — Z114 Encounter for screening for human immunodeficiency virus [HIV]: Secondary | ICD-10-CM

## 2022-08-18 DIAGNOSIS — M25561 Pain in right knee: Secondary | ICD-10-CM

## 2022-08-18 LAB — LIPID PANEL
Cholesterol: 163 mg/dL (ref 0–200)
HDL: 58 mg/dL (ref >39.00)
LDL Cholesterol: 75 mg/dL (ref 0–99)
NonHDL: 105.12
Total CHOL/HDL Ratio: 3
Triglycerides: 153 mg/dL — ABNORMAL HIGH (ref 0.0–149.0)
VLDL: 30.6 mg/dL (ref 0.0–40.0)

## 2022-08-18 LAB — COMPREHENSIVE METABOLIC PANEL
ALT: 13 U/L (ref 0–35)
AST: 15 U/L (ref 0–37)
Albumin: 4.4 g/dL (ref 3.5–5.2)
Alkaline Phosphatase: 58 U/L (ref 39–117)
BUN: 16 mg/dL (ref 6–23)
CO2: 32 mEq/L (ref 19–32)
Calcium: 9.4 mg/dL (ref 8.4–10.5)
Chloride: 101 mEq/L (ref 96–112)
Creatinine, Ser: 0.7 mg/dL (ref 0.40–1.20)
GFR: 90.9 mL/min (ref >60.00)
Glucose, Bld: 113 mg/dL — ABNORMAL HIGH (ref 70–99)
Potassium: 4.4 mEq/L (ref 3.5–5.1)
Sodium: 140 mEq/L (ref 135–145)
Total Bilirubin: 0.5 mg/dL (ref 0.2–1.2)
Total Protein: 7 g/dL (ref 6.0–8.3)

## 2022-08-18 LAB — CBC
HCT: 37.8 % (ref 36.0–46.0)
Hemoglobin: 12.9 g/dL (ref 12.0–15.0)
MCHC: 34 g/dL (ref 30.0–36.0)
MCV: 90.9 fl (ref 78.0–100.0)
Platelets: 262 10*3/uL (ref 150.0–400.0)
RBC: 4.16 Mil/uL (ref 3.87–5.11)
RDW: 13.6 % (ref 11.5–15.5)
WBC: 6.3 10*3/uL (ref 4.0–10.5)

## 2022-08-18 LAB — TSH: TSH: 0.62 u[IU]/mL (ref 0.35–5.50)

## 2022-08-18 LAB — HEMOGLOBIN A1C: Hgb A1c MFr Bld: 6.1 % (ref 4.6–6.5)

## 2022-08-18 NOTE — Progress Notes (Signed)
[facility] [address] [place], [place] [address] [phone]  Date:  08/18/2022   Name:  Amber Nichols   DOB:  1955/12/18   MRN:  [id number]  PCP:  Pearline Cables, MD    Chief Complaint: Follow-up (Concerns/ questions: Right breast pain. daughter would like a CMP, CBC, Ferretin, A1C /Flu shot today: yes)   History of Present Illness:  Amber Nichols is a 66 y.o. very pleasant female patient who presents with the following:  Seen today for routine recheck Last seen by myself in February of this year History of hypertension, pre-diabetes, anxiety/ depression- she has cardiology care  Patient requires an interpreter due to language barrier, tele- interpreter used today as her daughter is not present  Labs are due  Flu shot: give today  Give pneumonia vaccine today as well   Pt notes that "when my blood pressure goes up" I will feel tired and my nose will appear red" Her BP may be 120-130/ ? I am a bit confused by how she is taking her metoprolol.  It seems she is taking 1/2 tablet most days, may take a second tablet if her blood pressure seems elevated In any case, advised her that her blood pressure looks good today.  Also advised that the systolic pressure of 1 20-1 30 is normal  She did start taking her fluoxetine and is tolerating well  She takes metformin a few times a week  Patient Active Problem List   Diagnosis Date Noted   Prediabetes 07/29/2021   Precordial chest pain 10/03/2020   Dyspnea on exertion 10/03/2020   Night terror    Hypertension    COVID-19 virus infection 2020    Past Medical History:  Diagnosis Date   COVID-19 virus infection 2020   Hypertension    Night terror     Past Surgical History:  Procedure Laterality Date   COLONOSCOPY WITH PROPOFOL N/A 02/10/2020   Procedure: COLONOSCOPY WITH PROPOFOL;  Surgeon: Wyline Mood, MD;  Location: [facility] ENDOSCOPY;  Service:  Gastroenterology;  Laterality: N/A;  C-19 POSITIVE ON 01/13/20   DILATION AND CURETTAGE OF UTERUS     ESOPHAGOGASTRODUODENOSCOPY (EGD) WITH PROPOFOL N/A 02/10/2020   Procedure: ESOPHAGOGASTRODUODENOSCOPY (EGD) WITH PROPOFOL;  Surgeon: Wyline Mood, MD;  Location: [facility] ENDOSCOPY;  Service: Gastroenterology;  Laterality: N/A;   HEMORRHOID SURGERY      Social History   Tobacco Use   Smoking status: Never   Smokeless tobacco: Never  Vaping Use   Vaping Use: Never used  Substance Use Topics   Alcohol use: Never   Drug use: Never    Family History  Problem Relation Age of Onset   Breast cancer Sister    Colon cancer Neg Hx    Esophageal cancer Neg Hx     No Known Allergies  Medication list has been reviewed and updated.  Current Outpatient Medications on File Prior to Visit  Medication Sig Dispense Refill   ALPRAZolam (XANAX) 0.5 MG tablet TAKE 1/2 TABLET(0.25 MG) BY MOUTH AT BEDTIME AS NEEDED FOR ANXIETY 45 tablet 0   atorvastatin (LIPITOR) 10 MG tablet Take 1 tablet (10 mg total) by mouth daily. 90 tablet 3   FLUoxetine (PROZAC) 20 MG capsule TAKE 1 CAPSULE(20 MG) BY MOUTH DAILY 30 capsule 6   gabapentin (NEURONTIN) 100 MG capsule TAKE 1 CAPSULE(100 MG) BY MOUTH AT BEDTIME 90 capsule 1   meloxicam (MOBIC) 15 MG tablet Take 1 tablet (  15 mg total) by mouth daily. 30 tablet 0   metFORMIN (GLUCOPHAGE) 500 MG tablet Take 1 tablet (500 mg total) by mouth 2 (two) times daily with a meal. 180 tablet 3   metoprolol succinate (TOPROL-XL) 50 MG 24 hr tablet Take 1 tablet (50 mg total) by mouth daily. May take 2 tablets if BP is elevated. Take with or immediately following a meal. 145 tablet 3   No current facility-administered medications on file prior to visit.    Review of Systems:  As per HPI- otherwise negative.   Physical Examination: Vitals:   08/18/22 1310  BP: 122/70  Pulse: 61  Resp: 18  Temp: 97.9 F (36.6 C)  SpO2: 97%   Vitals:   08/18/22 1310  Weight: 163 lb 6.4  oz (74.1 kg)   Body mass index is 28.05 kg/m. Ideal Body Weight:    GEN: no acute distress.  Overweight, looks well  HEENT: Atraumatic, Normocephalic. Bilateral TM wnl, oropharynx normal.  PEERL,EOMI.   Ears and Nose: No external deformity. CV: RRR, No M/G/R. No JVD. No thrill. No extra heart sounds. PULM: CTA B, no wheezes, crackles, rhonchi. No retractions. No resp. distress. No accessory muscle use. ABD: S, NT, ND, +BS. No rebound. No HSM. EXTR: No c/c/e PSYCH: Normally interactive. Conversant.  Patient is favoring her right knee.  On exam there is no evidence of acute infection.  Normal range of motion without any significant pain  Assessment and Plan: Screening, lipid - Plan: Lipid panel  Deficiency anemia - Plan: CBC  Fatigue, unspecified type - Plan: CBC, TSH  Screening for diabetes mellitus - Plan: Hemoglobin A1c  Prediabetes - Plan: Hemoglobin A1c  Hypertension, unspecified type - Plan: CBC, Comprehensive metabolic panel  Immunization due - Plan: Pneumococcal conjugate vaccine 20-valent (Prevnar 20)  Screening for HIV without presence of risk factors - Plan: HIV Antibody (routine testing w rflx)  Chronic pain of right knee - Plan: Ambulatory referral to Orthopedic Surgery  Need for immunization against influenza - Plan: Flu Vaccine QUAD High Dose(Fluad)  Patient seen today for follow-up.  Labs are pending as above Gave flu shot and pneumonia vaccine She has significant right knee pain.  She is already seeing sports medicine.  Patient notes she has had a couple of injections, from her report surgery may not be an option right now? In any case, she would like to see orthopedics.  I went ahead and did a referral for her  Signed Abbe Amsterdam, MD  Received labs as below, message to patient Results for orders placed or performed in visit on 08/18/22  CBC  Result Value Ref Range   WBC 6.3 4.0 - 10.5 K/uL   RBC 4.16 3.87 - 5.11 Mil/uL   Platelets 262.0 150.0 -  400.0 K/uL   Hemoglobin 12.9 12.0 - 15.0 g/dL   HCT 32.4 40.1 - 02.7 %   MCV 90.9 78.0 - 100.0 fl   MCHC 34.0 30.0 - 36.0 g/dL   RDW 25.3 66.4 - 40.3 %  Comprehensive metabolic panel  Result Value Ref Range   Sodium 140 135 - 145 mEq/L   Potassium 4.4 3.5 - 5.1 mEq/L   Chloride 101 96 - 112 mEq/L   CO2 32 19 - 32 mEq/L   Glucose, Bld 113 (H) 70 - 99 mg/dL   BUN 16 6 - 23 mg/dL   Creatinine, Ser 4.74 0.40 - 1.20 mg/dL   Total Bilirubin 0.5 0.2 - 1.2 mg/dL   Alkaline Phosphatase 58 39 -  117 U/L   AST 15 0 - 37 U/L   ALT 13 0 - 35 U/L   Total Protein 7.0 6.0 - 8.3 g/dL   Albumin 4.4 3.5 - 5.2 g/dL   GFR 67.12 >45.80 mL/min   Calcium 9.4 8.4 - 10.5 mg/dL  Hemoglobin D9I  Result Value Ref Range   Hgb A1c MFr Bld 6.1 4.6 - 6.5 %  Lipid panel  Result Value Ref Range   Cholesterol 163 0 - 200 mg/dL   Triglycerides 338.2 (H) 0.0 - 149.0 mg/dL   HDL 50.53 >97.67 mg/dL   VLDL 34.1 0.0 - 93.7 mg/dL   LDL Cholesterol 75 0 - 99 mg/dL   Total CHOL/HDL Ratio 3    NonHDL 105.12   TSH  Result Value Ref Range   TSH 0.62 0.35 - 5.50 uIU/mL

## 2022-08-18 NOTE — Patient Instructions (Signed)
Good to see you today- I will be in touch with your labs Please see me in 6 months Flu shot and pneumonia given today

## 2022-08-19 LAB — HIV ANTIBODY (ROUTINE TESTING W REFLEX): HIV 1&2 Ab, 4th Generation: NONREACTIVE

## 2022-09-02 ENCOUNTER — Telehealth: Payer: Self-pay | Admitting: Orthopaedic Surgery

## 2022-09-02 ENCOUNTER — Encounter: Payer: Self-pay | Admitting: Orthopaedic Surgery

## 2022-09-02 ENCOUNTER — Ambulatory Visit (INDEPENDENT_AMBULATORY_CARE_PROVIDER_SITE_OTHER): Payer: Self-pay | Admitting: Orthopaedic Surgery

## 2022-09-02 ENCOUNTER — Ambulatory Visit (INDEPENDENT_AMBULATORY_CARE_PROVIDER_SITE_OTHER): Payer: Self-pay

## 2022-09-02 ENCOUNTER — Other Ambulatory Visit: Payer: Self-pay

## 2022-09-02 ENCOUNTER — Ambulatory Visit: Payer: Commercial Managed Care - HMO | Admitting: Orthopaedic Surgery

## 2022-09-02 DIAGNOSIS — M1711 Unilateral primary osteoarthritis, right knee: Secondary | ICD-10-CM

## 2022-09-02 MED ORDER — MELOXICAM 7.5 MG PO TABS: 7.5000 mg | ORAL_TABLET | Freq: Two times a day (BID) | ORAL | 2 refills | Status: AC | PRN

## 2022-09-02 MED ORDER — MELOXICAM 15 MG PO TABS: 15.0000 mg | ORAL_TABLET | Freq: Every day | ORAL | 0 refills | Status: DC

## 2022-09-02 MED ORDER — PREDNISONE 10 MG (21) PO TBPK: ORAL_TABLET | ORAL | 3 refills | Status: DC

## 2022-09-02 NOTE — Addendum Note (Signed)
Addended by: Mayra Reel on: 09/02/2022 05:08 PM   Modules accepted: Orders

## 2022-09-02 NOTE — Telephone Encounter (Signed)
done

## 2022-09-02 NOTE — Progress Notes (Signed)
Office Visit Note   Patient: Amber Nichols           Date of Birth: 1955-09-19           MRN: [id number] Visit Date: 09/02/2022              Requested by: Pearline Cables, MD [address] [place],  [place] [address] PCP: Pearline Cables, MD   Assessment & Plan: Visit Diagnoses:  1. Primary osteoarthritis of right knee     Plan: Impression is right knee osteoarthritis.  Treatment options were reviewed.  We talked about the risk benefits of taking NSAIDs.  I have recommended Voltaren gel and provided Ace bandages for compression.  Follow-up as needed.  Follow-Up Instructions: No follow-ups on file.   Orders:  Orders Placed This Encounter  Procedures   XR KNEE 3 VIEW RIGHT   No orders of the defined types were placed in this encounter.     Procedures: No procedures performed   Clinical Data: No additional findings.   Subjective: Chief Complaint  Patient presents with   Right Knee - Pain    HPI Patient is a 66 year old old female with 5 years of right knee pain due to osteoarthritis.  Symptoms have gotten worse.  She picked up a massage machine off [employer] which she states helps.  She did experience improvement after cortisone injection and meloxicam but is afraid of side effects therefore she is not interested.  Interpreter present today. Review of Systems  Constitutional: Negative.   HENT: Negative.    Eyes: Negative.   Respiratory: Negative.    Cardiovascular: Negative.   Endocrine: Negative.   Musculoskeletal: Negative.   Neurological: Negative.   Hematological: Negative.   Psychiatric/Behavioral: Negative.    All other systems reviewed and are negative.    Objective: Vital Signs: There were no vitals taken for this visit.  Physical Exam Vitals and nursing note reviewed.  Constitutional:      Appearance: She is well-developed.  HENT:     Head: Normocephalic and atraumatic.  Pulmonary:     Effort: Pulmonary effort is  normal.  Abdominal:     Palpations: Abdomen is soft.  Musculoskeletal:     Cervical back: Neck supple.  Skin:    General: Skin is warm.     Capillary Refill: Capillary refill takes less than 2 seconds.  Neurological:     Mental Status: She is alert and oriented to person, place, and time.  Psychiatric:        Behavior: Behavior normal.        Thought Content: Thought content normal.        Judgment: Judgment normal.     Ortho Exam Examination of the right knee shows no joint effusion.  Slight joint line tenderness.  Collaterals and cruciates are stable.  Crepitus with range of motion. Specialty Comments:  No specialty comments available.  Imaging: No results found.   PMFS History: Patient Active Problem List   Diagnosis Date Noted   Prediabetes 07/29/2021   Precordial chest pain 10/03/2020   Dyspnea on exertion 10/03/2020   Night terror    Hypertension    COVID-19 virus infection 2020   Past Medical History:  Diagnosis Date   COVID-19 virus infection 2020   Hypertension    Night terror     Family History  Problem Relation Age of Onset   Breast cancer Sister    Colon cancer Neg Hx    Esophageal cancer  Neg Hx     Past Surgical History:  Procedure Laterality Date   COLONOSCOPY WITH PROPOFOL N/A 02/10/2020   Procedure: COLONOSCOPY WITH PROPOFOL;  Surgeon: Jonathon Bellows, MD;  Location[facility] ENDOSCOPY;  Service: Gastroenterology;  Laterality: N/A;  C-19 POSITIVE ON 01/13/20   DILATION AND CURETTAGE OF UTERUS     ESOPHAGOGASTRODUODENOSCOPY (EGD) WITH PROPOFOL N/A 02/10/2020   Procedure: ESOPHAGOGASTRODUODENOSCOPY (EGD) WITH PROPOFOL;  Surgeon: Jonathon Bellows, MD;  Location[facility] ENDOSCOPY;  Service: Gastroenterology;  Laterality: N/A;   HEMORRHOID SURGERY     Social History   Occupational History   Not on file  Tobacco Use   Smoking status: Never   Smokeless tobacco: Never  Vaping Use   Vaping Use: Never used  Substance and Sexual Activity   Alcohol use: Never    Drug use: Never   Sexual activity: Not on file

## 2022-09-02 NOTE — Telephone Encounter (Signed)
Patient is requesting Meloxicam and Prednisone 10 mg for pain. Please send to [facility].Marland Kitchen

## 2022-09-18 ENCOUNTER — Other Ambulatory Visit: Payer: Self-pay | Admitting: Family

## 2022-09-26 ENCOUNTER — Other Ambulatory Visit: Payer: Self-pay

## 2022-10-22 IMAGING — US US PELVIS COMPLETE WITH TRANSVAGINAL
2 series · 13 of 25 positions shown · non-contrast
Comparison: None; correlation remote CT 09/03/2020

CLINICAL DATA: Ab[place]
demonstrate thickened endometrial complex



[Series 2: us pelvis complete with transvaginal · 12 of 60 slices shown (1 of 2)]
[im 1/60]
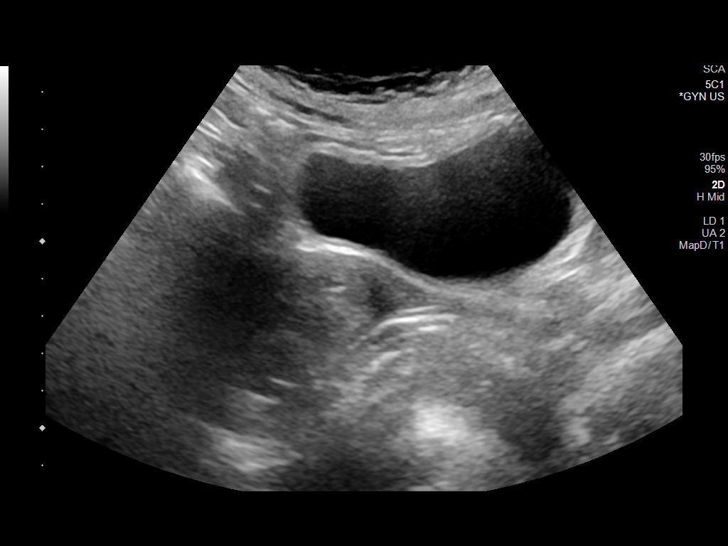
[im 6/60]
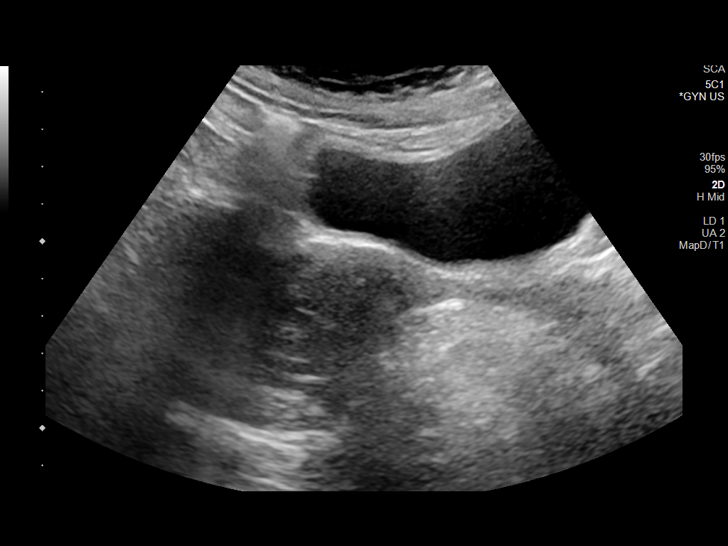
[im 11/60]
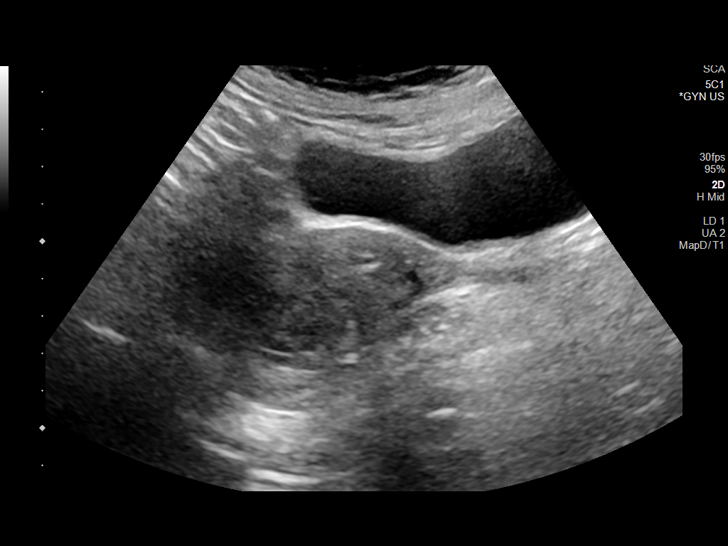
[im 16/60]
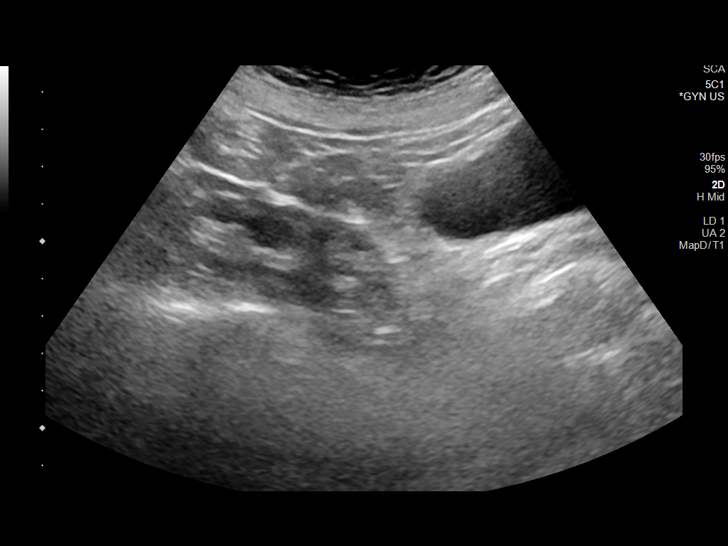
[im 21/60]
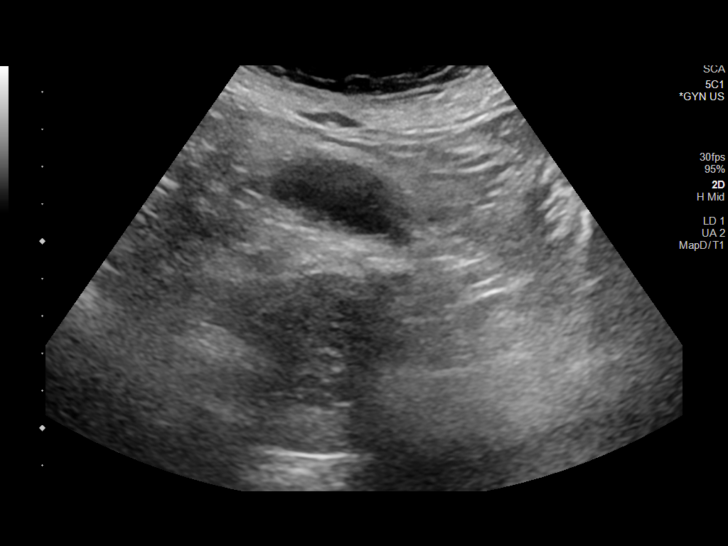
[im 26/60]
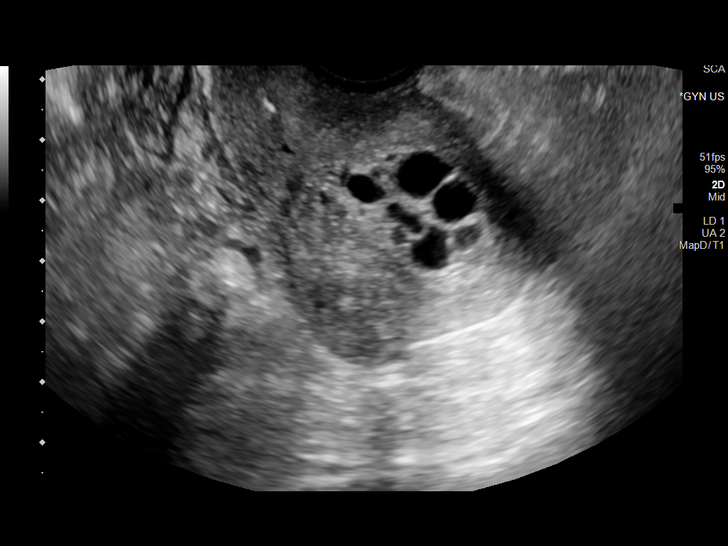
[im 31/60]
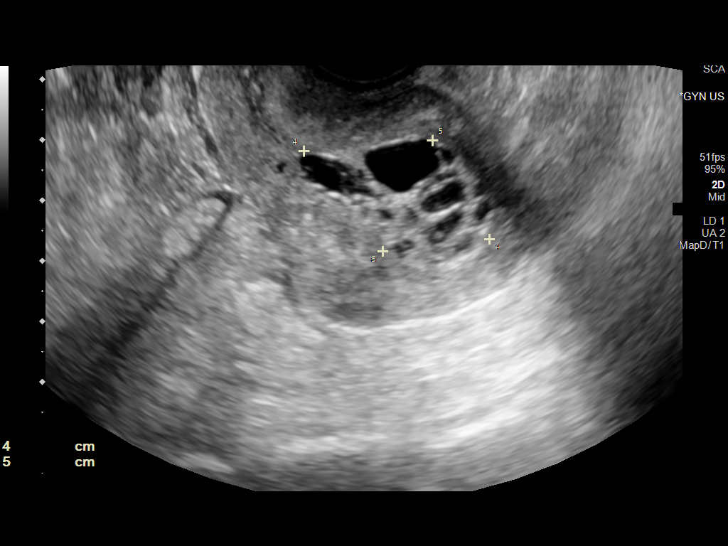
[im 36/60]
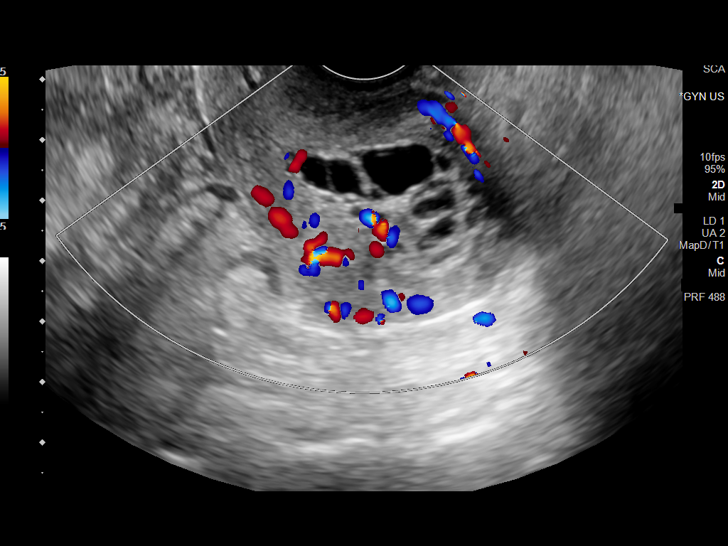
[im 42/60]
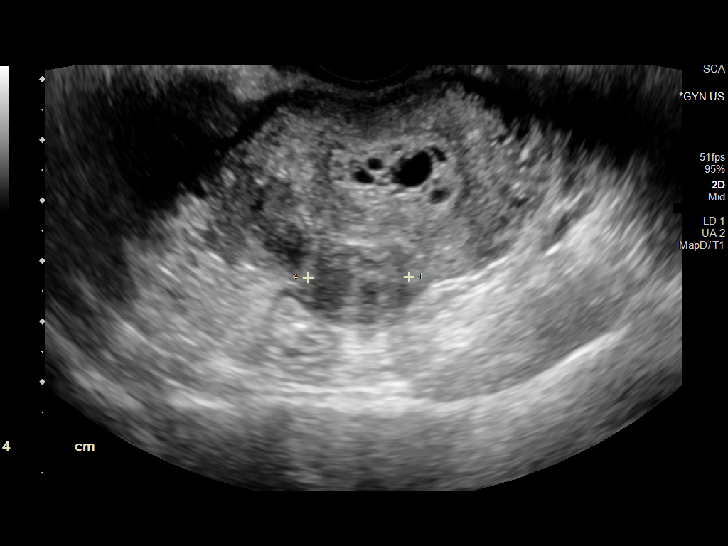
[im 47/60]
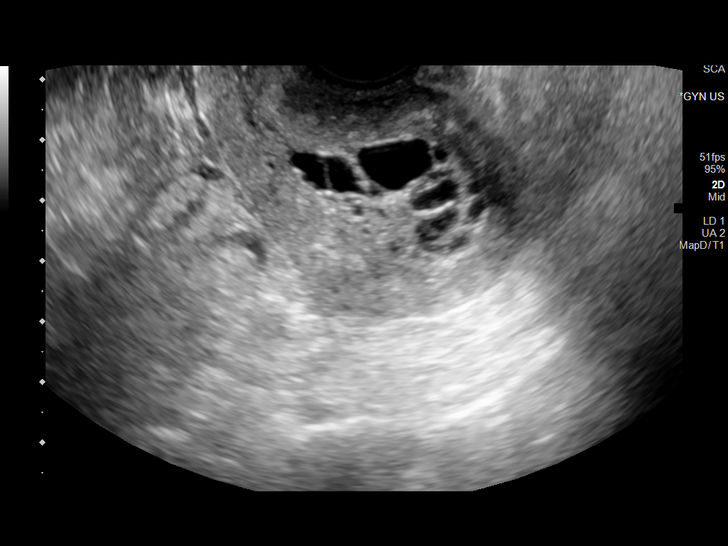
[im 52/60]
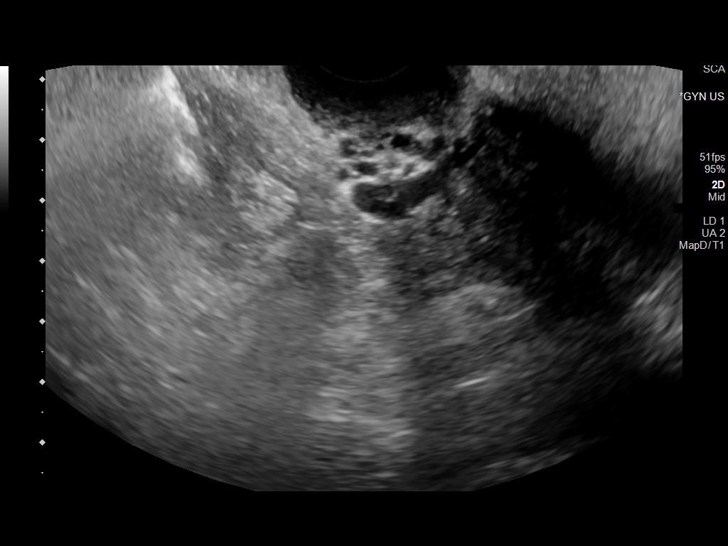
[im 57/60]
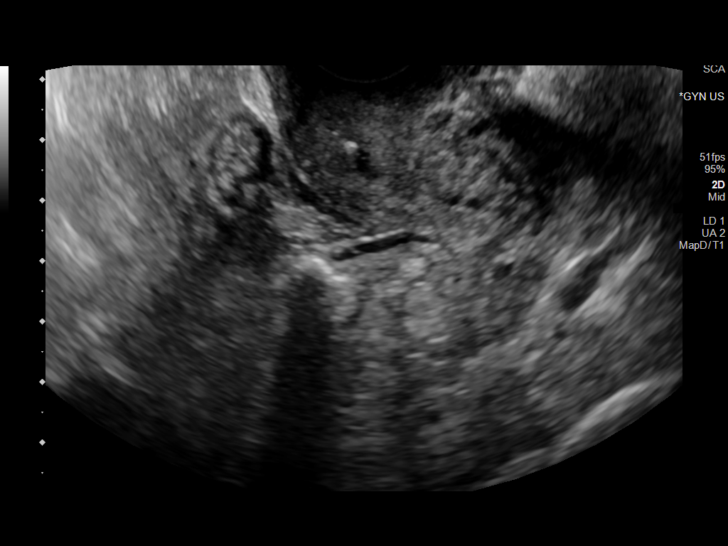

[Series 3: us pelvis complete with transvaginal · 1 of 2 slices shown (2 of 2)]
[im 1/2]
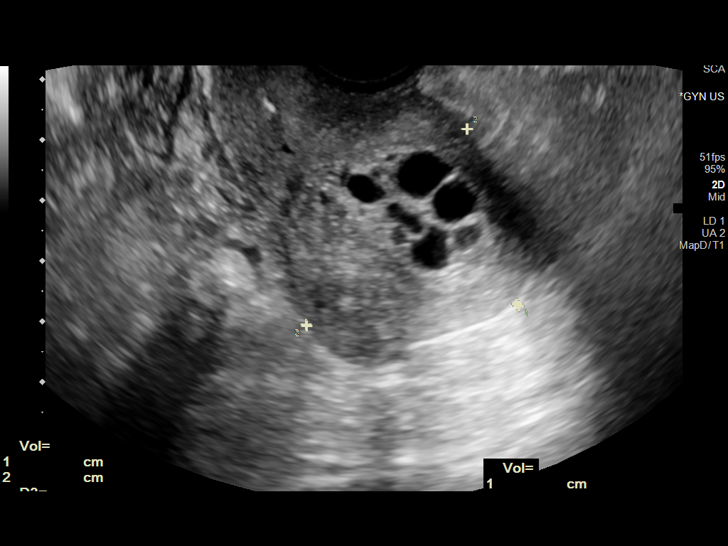

[13 of 25 positions shown; findings below may reference images not displayed]

FINDINGS: Uterus

Measurements: 6.5 x 4.2 x 5.1 cm = volume: 73 mL. Retroverted.
Heterogeneous myometrium. Subserosal leiomyomata at anterior upper
uterus measuring up to 17 mm and 15 mm in size. No additional
masses.

Endometrium

Thickness: 20 mm. Abnormal thickened, containing numerous cystic
foci of uncertain etiology but appear to be present on 9595 CT.
Largest cystic component is 12 mm diameter. This could represent
cystic degeneration of the myometrium though endometrial neoplasm is
not excluded.

Right ovary

Not visualized, likely obscured by bowel

Left ovary

Not visualized, likely obscured by bowel

Other findings

No free pelvic fluid.  No adnexal masses.
IMPRESSION: 2 small subserosal leiomyomata at anterior upper uterus.

Nonvisualization of ovaries.

Multiple cystic foci distend the endometrial canal at the upper
uterine segment, well-defined, individual cysts up to 12 mm
diameter.

This may represent cystic degeneration of the endometrium though
endometrial cancer is not excluded.

Tissue diagnosis recommended.

These results will be called to the ordering clinician or
representative by the Radiologist Assistant, and communication
documented in the PACS or [REDACTED].

## 2022-10-22 IMAGING — US US ABDOMEN LIMITED
1 series · 14 of 25 positions shown · non-contrast
Comparison: None.

CLINICAL DATA: Right upper quadrant abdominal pain

EXAM:
ULTRASOUND ABDOMEN LIMITED RIGHT UPPER QUADRANT

[Series 2: us abdomen limited · 14 of 43 slices shown]
[im 1/43]
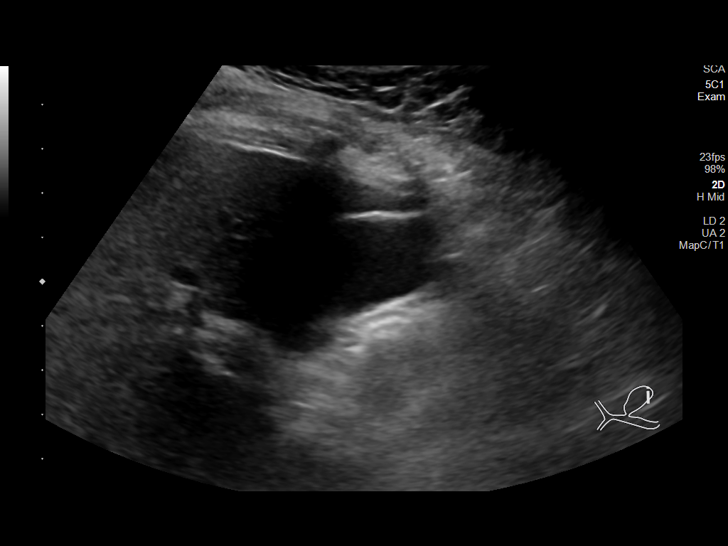
[im 4/43]
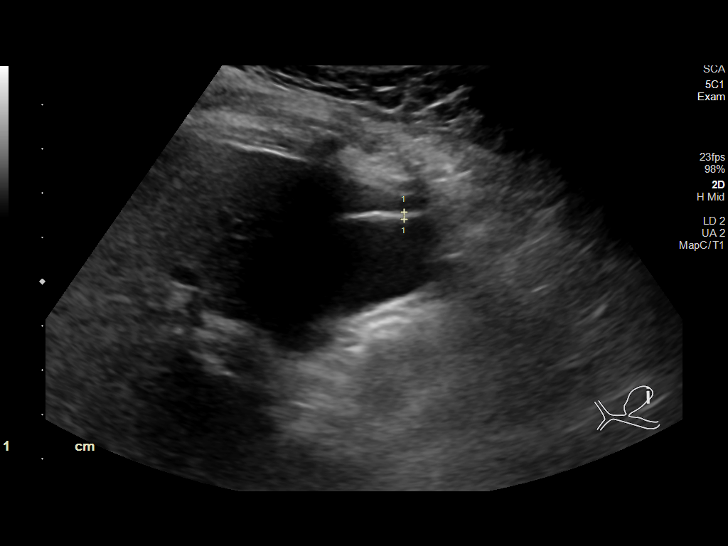
[im 8/43]
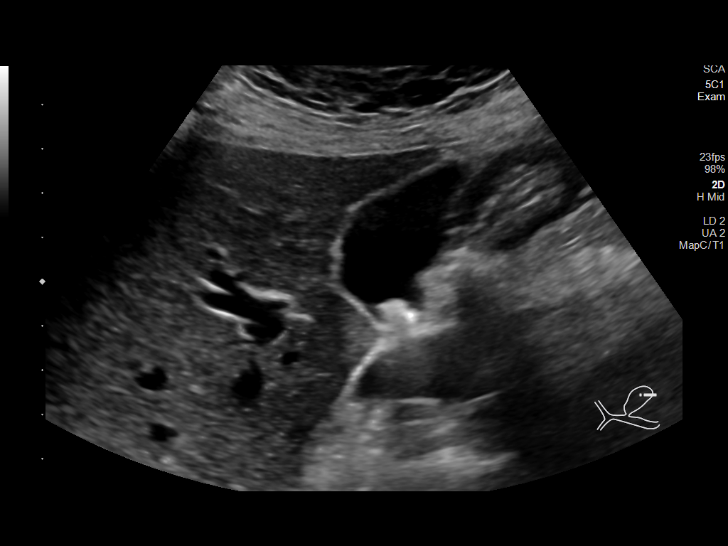
[im 11/43]
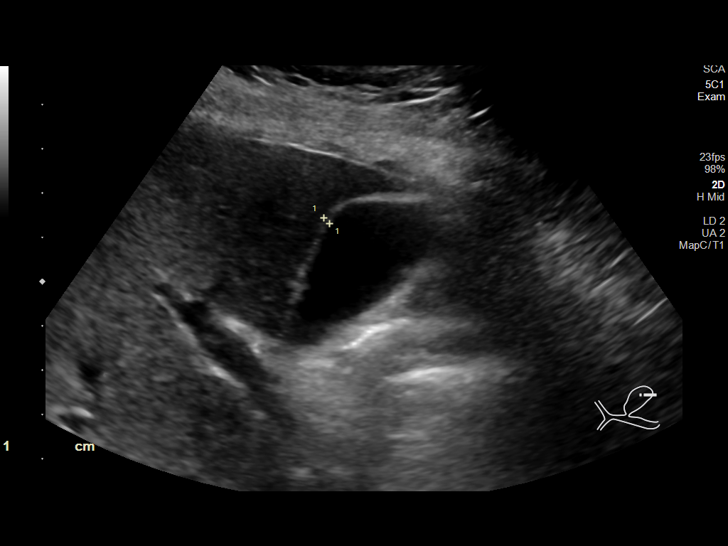
[im 15/43]
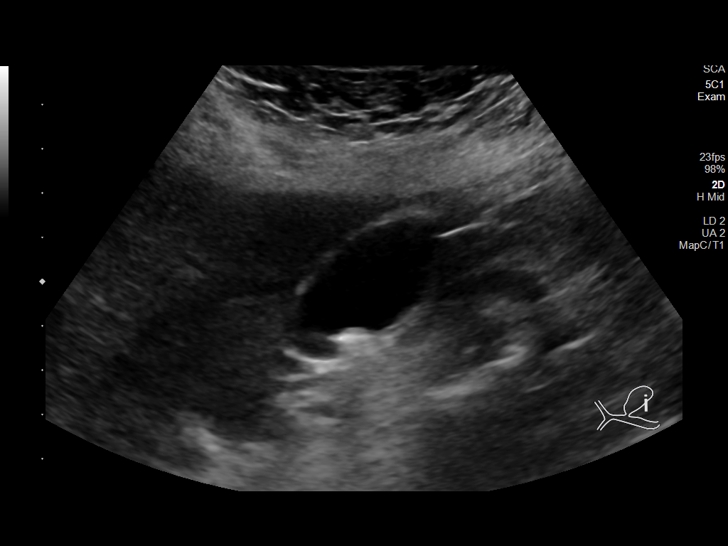
[im 16/43]
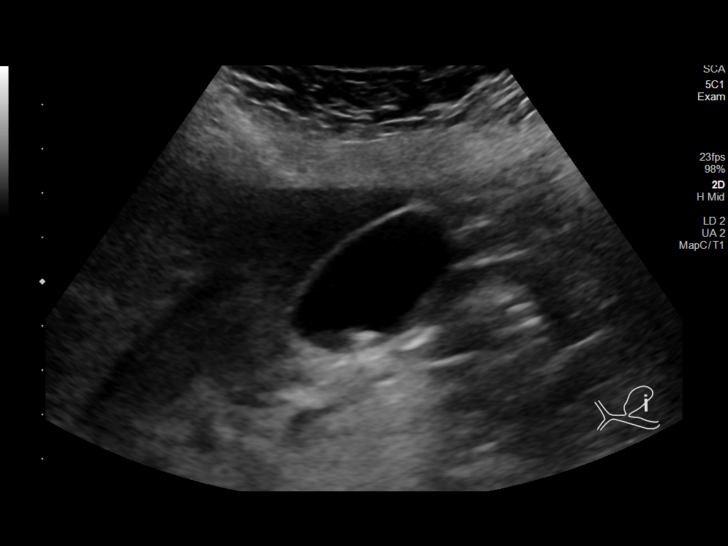
[im 20/43]
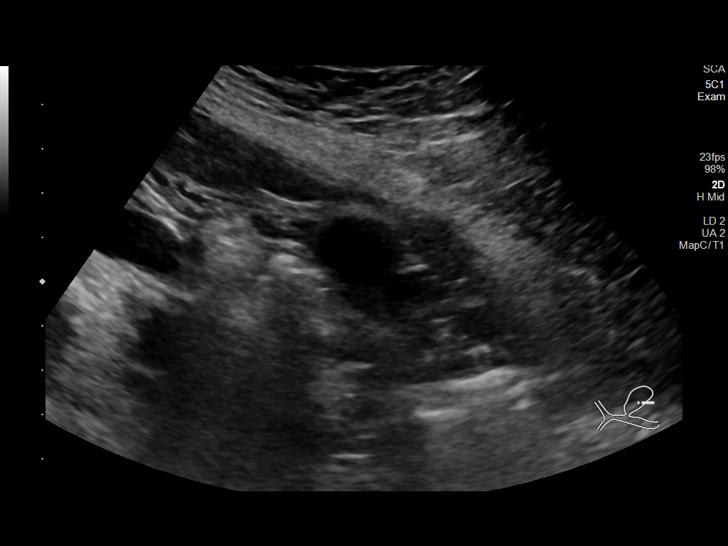
[im 23/43]
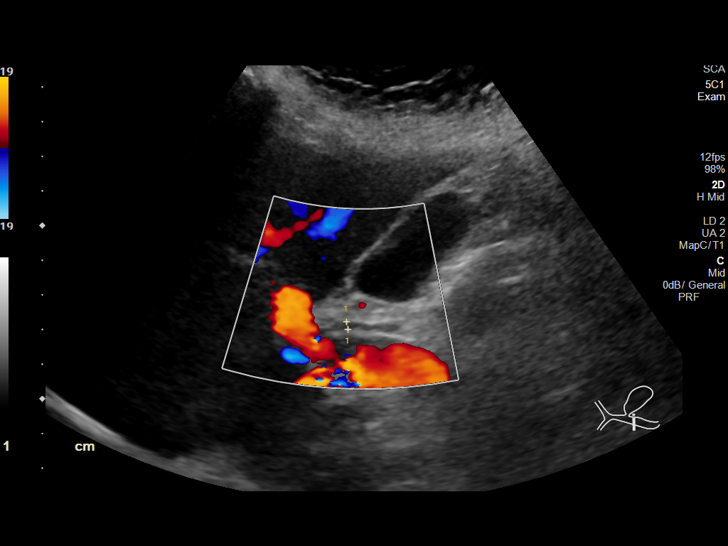
[im 27/43]
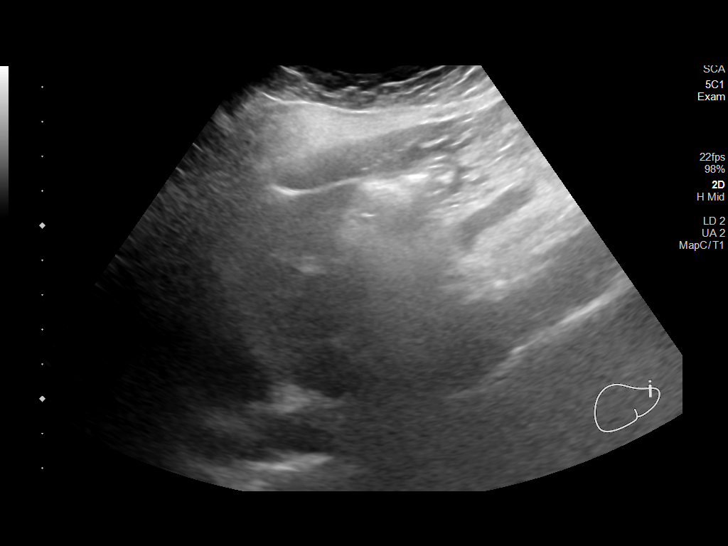
[im 29/43]
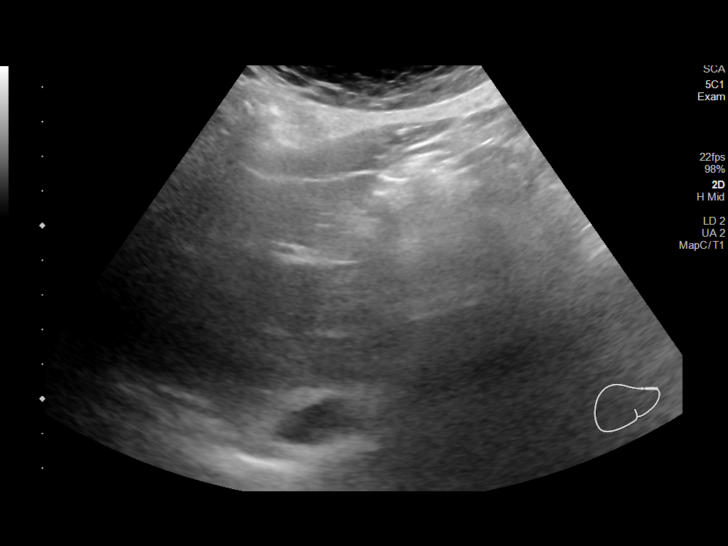
[im 32/43]
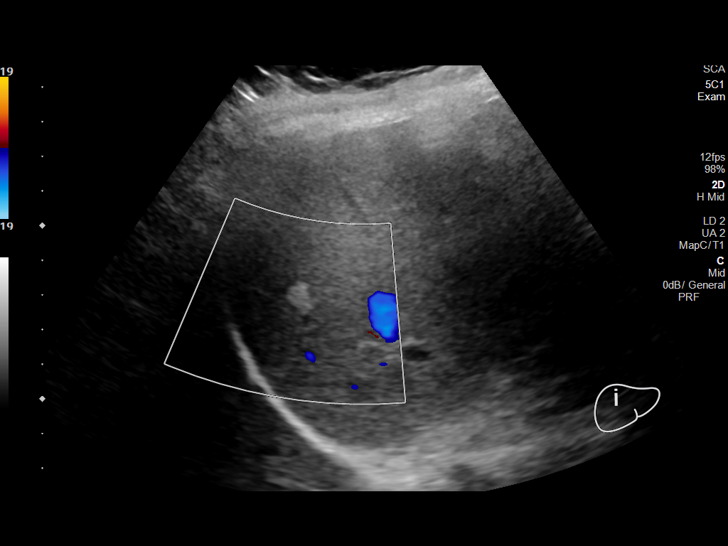
[im 36/43]
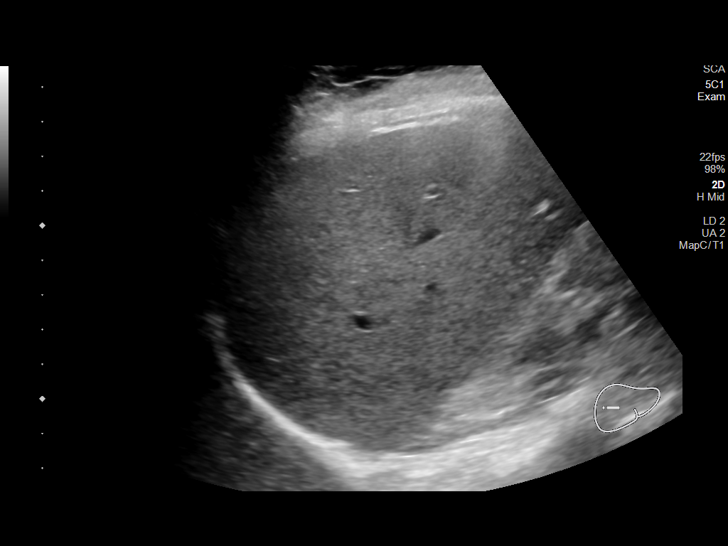
[im 39/43]
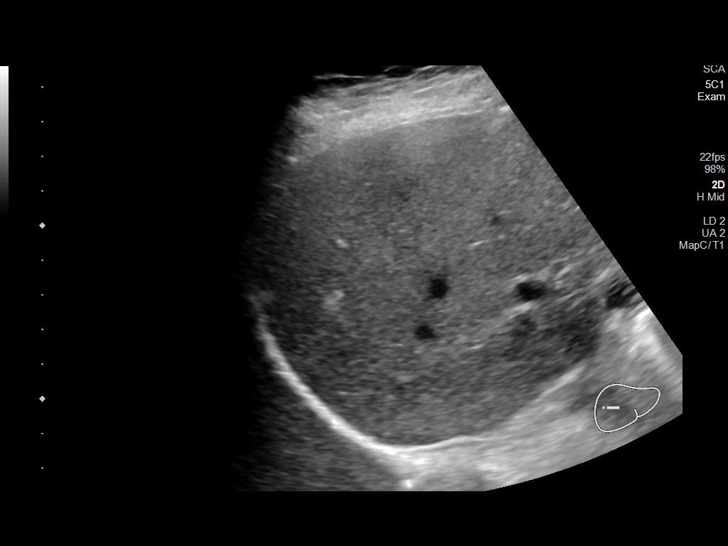
[im 43/43]
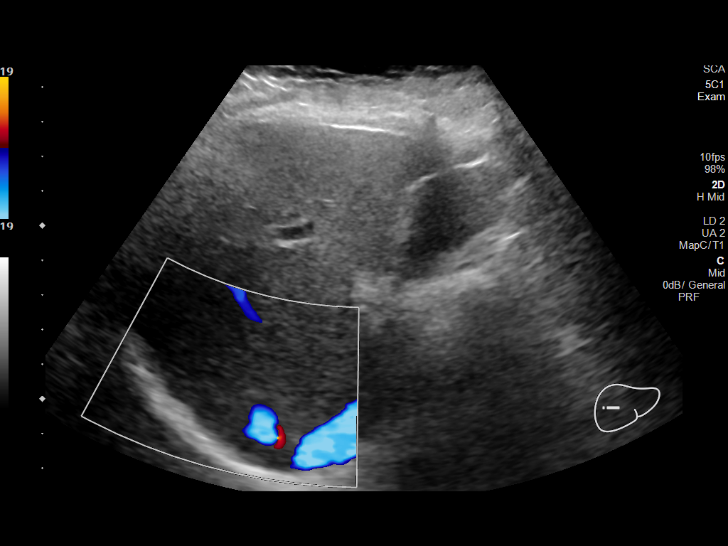

[14 of 25 positions shown; findings below may reference images not displayed]

FINDINGS: Gallbladder:

Multiple layering gallstones are seen within the gallbladder. The
gallbladder, however, is not distended, there is no gallbladder wall
thickening, and no pericholecystic fluid is identified. The
sonographic Murphy sign is reportedly negative.

Common bile duct:

Diameter: 2 mm in proximal diameter

Liver:

A 10 mm homogeneously hyperechoic solid mass is seen within the
right hepatic lobe most compatible with a benign cavernous
hemangioma in a patient without a history of malignancy. No
additional intrahepatic masses are identified. No intrahepatic
biliary ductal dilation. Hepatic parenchymal echogenicity is within
normal limits. Portal vein is patent on color Doppler imaging with
normal direction of blood flow towards the liver.

Other: None.
IMPRESSION: Cholelithiasis without sonographic evidence of acute cholecystitis.

10 mm probable benign cavernous hemangioma within the right hepatic
lobe.

## 2022-11-01 ENCOUNTER — Other Ambulatory Visit: Payer: Self-pay | Admitting: Family Medicine

## 2022-11-01 DIAGNOSIS — E785 Hyperlipidemia, unspecified: Secondary | ICD-10-CM

## 2022-11-03 ENCOUNTER — Encounter: Payer: Self-pay | Admitting: *Deleted

## 2022-11-13 ENCOUNTER — Other Ambulatory Visit: Payer: Self-pay | Admitting: Family Medicine

## 2022-11-13 DIAGNOSIS — F514 Sleep terrors [night terrors]: Secondary | ICD-10-CM

## 2022-11-18 ENCOUNTER — Other Ambulatory Visit: Payer: Self-pay | Admitting: Family Medicine

## 2022-11-18 DIAGNOSIS — R7303 Prediabetes: Secondary | ICD-10-CM

## 2022-11-20 ENCOUNTER — Encounter: Payer: Self-pay | Admitting: *Deleted

## 2023-01-03 ENCOUNTER — Other Ambulatory Visit: Payer: Self-pay | Admitting: Family Medicine

## 2023-01-05 ENCOUNTER — Encounter: Payer: Self-pay | Admitting: *Deleted

## 2023-01-20 ENCOUNTER — Encounter: Payer: Self-pay | Admitting: *Deleted

## 2023-02-02 ENCOUNTER — Other Ambulatory Visit: Payer: Self-pay | Admitting: Family Medicine

## 2023-03-06 ENCOUNTER — Other Ambulatory Visit: Payer: Self-pay | Admitting: Family Medicine

## 2023-03-16 ENCOUNTER — Other Ambulatory Visit: Payer: Self-pay | Admitting: Family Medicine

## 2023-03-24 ENCOUNTER — Other Ambulatory Visit: Payer: Self-pay | Admitting: Family

## 2023-03-24 ENCOUNTER — Telehealth: Payer: Self-pay | Admitting: Family Medicine

## 2023-03-24 ENCOUNTER — Other Ambulatory Visit: Payer: Self-pay

## 2023-03-24 DIAGNOSIS — E785 Hyperlipidemia, unspecified: Secondary | ICD-10-CM

## 2023-03-24 DIAGNOSIS — F514 Sleep terrors [night terrors]: Secondary | ICD-10-CM

## 2023-03-24 DIAGNOSIS — R7303 Prediabetes: Secondary | ICD-10-CM

## 2023-03-24 MED ORDER — METFORMIN HCL 500 MG PO TABS
ORAL_TABLET | ORAL | 0 refills | Status: DC
Start: 2023-03-24 — End: 2023-04-20

## 2023-03-24 MED ORDER — FLUOXETINE HCL 20 MG PO CAPS: ORAL_CAPSULE | ORAL | 0 refills | Status: DC

## 2023-03-24 MED ORDER — ALPRAZOLAM 0.5 MG PO TABS
ORAL_TABLET | ORAL | 0 refills | Status: DC
Start: 2023-03-24 — End: 2023-04-20

## 2023-03-24 MED ORDER — ATORVASTATIN CALCIUM 10 MG PO TABS
ORAL_TABLET | ORAL | 0 refills | Status: DC
Start: 2023-03-24 — End: 2023-07-10

## 2023-03-24 NOTE — Telephone Encounter (Signed)
Prescription Request  03/24/2023  Is this a "Controlled Substance" medicine? No  LOV: Visit date not found (Appt made for 04/20/23)   What is the name of the medication or equipment?  ALPRAZolam (XANAX) 0.5 MG tablet [[id number]]   atorvastatin (LIPITOR) 10 MG tablet [[id number]]   metFORMIN (GLUCOPHAGE) 500 MG tablet   FLUoxetine (PROZAC) 20 MG capsule [[id number]]   Have you contacted your pharmacy to request a refill? No   Which pharmacy would you like this sent to?  [id number] [place], [place] - [address] AT [facility] [address] [place] [address] Phone: [phone] Fax: [phone]    Patient notified that their request is being sent to the clinical staff for review and that they should receive a response within 2 business days.   Please advise at Mobile [phone] (mobile)

## 2023-03-24 NOTE — Telephone Encounter (Signed)
Refills have been sent in for all except Alprazolam. Okay for refill?

## 2023-03-26 ENCOUNTER — Other Ambulatory Visit: Payer: Self-pay | Admitting: Family

## 2023-04-16 NOTE — Progress Notes (Addendum)
[facility] at [facility] [address] [place], [place] [address] [phone]  Date:  04/20/2023   Name:  Amber Nichols   DOB:  1956-09-02   MRN:  [id number]  PCP:  Pearline Cables, MD    Chief Complaint: No chief complaint on file.   History of Present Illness:  Amber Nichols is a 67 y.o. very pleasant female patient who presents with the following:  Pt seen today for medication recheck and follow-up Last seen by myself in December History of hypertension, pre-diabetes, anxiety/ depression- she has cardiology care    She takes alprazolam at bedtime for night terrors  Mammogram Tdap Shingrix Colon UTD Bone density 6/21- can update   She recently had 2 thyroid nodules aspirated per [facility]-it looks like her results were benign  Labs were done in December  Patient Active Problem List   Diagnosis Date Noted   Prediabetes 07/29/2021   Precordial chest pain 10/03/2020   Dyspnea on exertion 10/03/2020   Night terror    Hypertension    COVID-19 virus infection 2020    Past Medical History:  Diagnosis Date   COVID-19 virus infection 2020   Hypertension    Night terror     Past Surgical History:  Procedure Laterality Date   COLONOSCOPY WITH PROPOFOL N/A 02/10/2020   Procedure: COLONOSCOPY WITH PROPOFOL;  Surgeon: Wyline Mood, MD;  Location: [facility] ENDOSCOPY;  Service: Gastroenterology;  Laterality: N/A;  C-19 POSITIVE ON 01/13/20   DILATION AND CURETTAGE OF UTERUS     ESOPHAGOGASTRODUODENOSCOPY (EGD) WITH PROPOFOL N/A 02/10/2020   Procedure: ESOPHAGOGASTRODUODENOSCOPY (EGD) WITH PROPOFOL;  Surgeon: Wyline Mood, MD;  Location: [facility] ENDOSCOPY;  Service: Gastroenterology;  Laterality: N/A;   HEMORRHOID SURGERY      Social History   Tobacco Use   Smoking status: Never   Smokeless tobacco: Never  Vaping Use   Vaping status: Never Used  Substance Use Topics   Alcohol use: Never   Drug use: Never    Family  History  Problem Relation Age of Onset   Breast cancer Sister    Colon cancer Neg Hx    Esophageal cancer Neg Hx     No Known Allergies  Medication list has been reviewed and updated.  Current Outpatient Medications on File Prior to Visit  Medication Sig Dispense Refill   ALPRAZolam (XANAX) 0.5 MG tablet TAKE HALF TABLET(0.25 MG) BY MOUTH AT BEDTIME AS NEEDED FOR ANXIETY 45 tablet 0   atorvastatin (LIPITOR) 10 MG tablet TAKE 1 TABLET(10 MG) BY MOUTH DAILY 90 tablet 0   FLUoxetine (PROZAC) 20 MG capsule TAKE 1 CAPSULE(20 MG) BY MOUTH DAILY 90 capsule 0   gabapentin (NEURONTIN) 100 MG capsule TAKE 1 CAPSULE(100 MG) BY MOUTH AT BEDTIME 90 capsule 1   meloxicam (MOBIC) 15 MG tablet Take 1 tablet (15 mg total) by mouth daily. 30 tablet 0   meloxicam (MOBIC) 7.5 MG tablet Take 1 tablet (7.5 mg total) by mouth 2 (two) times daily as needed for pain. 30 tablet 2   metFORMIN (GLUCOPHAGE) 500 MG tablet TAKE 1 TABLET(500 MG) BY MOUTH TWICE DAILY WITH A MEAL 180 tablet 0   metoprolol succinate (TOPROL-XL) 50 MG 24 hr tablet Take 1 tablet (50 mg total) by mouth daily. May take 2 tablets if BP is elevated. Take with or immediately following a meal. 145 tablet 3   predniSONE (STERAPRED UNI-PAK 21 TAB) 10 MG (21) TBPK tablet Take as directed 21 tablet  3   No current facility-administered medications on file prior to visit.    Review of Systems:  As per HPI- otherwise negative.   Physical Examination: There were no vitals filed for this visit. There were no vitals filed for this visit. There is no height or weight on file to calculate BMI. Ideal Body Weight:    GEN: no acute distress. HEENT: Atraumatic, Normocephalic.  Ears and Nose: No external deformity. CV: RRR, No M/G/R. No JVD. No thrill. No extra heart sounds. PULM: CTA B, no wheezes, crackles, rhonchi. No retractions. No resp. distress. No accessory muscle use. ABD: S, NT, ND, +BS. No rebound. No HSM. EXTR: No c/c/e PSYCH: Normally  interactive. Conversant.    Assessment and Plan: ***  Signed Abbe Amsterdam, MD

## 2023-04-20 ENCOUNTER — Ambulatory Visit: Payer: BLUE CROSS/BLUE SHIELD | Admitting: Family Medicine

## 2023-04-20 VITALS — BP 112/72 | HR 76 | Temp 97.6°F | Resp 18 | Ht 64.0 in | Wt 168.6 lb

## 2023-04-20 DIAGNOSIS — E785 Hyperlipidemia, unspecified: Secondary | ICD-10-CM | POA: Diagnosis not present

## 2023-04-20 DIAGNOSIS — F514 Sleep terrors [night terrors]: Secondary | ICD-10-CM

## 2023-04-20 DIAGNOSIS — I10 Essential (primary) hypertension: Secondary | ICD-10-CM

## 2023-04-20 DIAGNOSIS — E2839 Other primary ovarian failure: Secondary | ICD-10-CM

## 2023-04-20 DIAGNOSIS — Z1231 Encounter for screening mammogram for malignant neoplasm of breast: Secondary | ICD-10-CM

## 2023-04-20 DIAGNOSIS — R7303 Prediabetes: Secondary | ICD-10-CM | POA: Diagnosis not present

## 2023-04-20 MED ORDER — METOPROLOL SUCCINATE ER 50 MG PO TB24
50.0000 mg | ORAL_TABLET | Freq: Every day | ORAL | 3 refills | Status: DC
Start: 2023-04-20 — End: 2023-08-10

## 2023-04-20 MED ORDER — ALPRAZOLAM 0.5 MG PO TABS: ORAL_TABLET | ORAL | 1 refills | Status: DC

## 2023-04-20 MED ORDER — FLUOXETINE HCL 20 MG PO CAPS
ORAL_CAPSULE | ORAL | 3 refills | Status: AC
Start: 2023-04-20 — End: ?

## 2023-04-20 NOTE — Patient Instructions (Signed)
Good to see you today- I will be in touch with your labs Please stop by imaging on the ground floor to set up your mammogram and bone density scan  Try an OTC "PPI" medication such as Prilosec for burping- take daily for 2 weeks.   If this does not get rid of the burping please let me know Recommend a tetanus booster and shingles series at your pharmacy, and a covid booster flu this fall

## 2023-04-21 ENCOUNTER — Encounter: Payer: Self-pay | Admitting: Family Medicine

## 2023-04-30 ENCOUNTER — Telehealth (HOSPITAL_BASED_OUTPATIENT_CLINIC_OR_DEPARTMENT_OTHER): Payer: Self-pay

## 2023-06-12 ENCOUNTER — Other Ambulatory Visit: Payer: Self-pay | Admitting: Family Medicine

## 2023-06-12 DIAGNOSIS — F514 Sleep terrors [night terrors]: Secondary | ICD-10-CM

## 2023-07-10 ENCOUNTER — Other Ambulatory Visit: Payer: Self-pay | Admitting: Family Medicine

## 2023-07-10 DIAGNOSIS — E785 Hyperlipidemia, unspecified: Secondary | ICD-10-CM

## 2023-08-08 ENCOUNTER — Telehealth: Payer: Self-pay | Admitting: Family Medicine

## 2023-08-08 ENCOUNTER — Telehealth: Payer: Self-pay | Admitting: Family

## 2023-08-08 DIAGNOSIS — I10 Essential (primary) hypertension: Secondary | ICD-10-CM

## 2023-08-08 DIAGNOSIS — F514 Sleep terrors [night terrors]: Secondary | ICD-10-CM

## 2023-08-11 NOTE — Telephone Encounter (Signed)
Rx was sent in on 08/08/23.

## 2023-08-11 NOTE — Telephone Encounter (Signed)
Okay for refill on xanax?

## 2023-10-05 ENCOUNTER — Other Ambulatory Visit: Payer: Self-pay | Admitting: Orthopaedic Surgery

## 2023-10-21 ENCOUNTER — Other Ambulatory Visit: Payer: Self-pay | Admitting: Family

## 2023-10-26 ENCOUNTER — Other Ambulatory Visit: Payer: Self-pay | Admitting: Family Medicine

## 2023-10-26 DIAGNOSIS — F514 Sleep terrors [night terrors]: Secondary | ICD-10-CM

## 2023-10-27 ENCOUNTER — Encounter: Payer: Self-pay | Admitting: Family Medicine

## 2023-10-27 DIAGNOSIS — F514 Sleep terrors [night terrors]: Secondary | ICD-10-CM

## 2024-01-22 ENCOUNTER — Other Ambulatory Visit: Payer: Self-pay | Admitting: Family Medicine

## 2024-01-22 DIAGNOSIS — F514 Sleep terrors [night terrors]: Secondary | ICD-10-CM

## 2024-01-22 MED ORDER — ALPRAZOLAM 0.5 MG PO TABS
ORAL_TABLET | ORAL | 0 refills | Status: DC
Start: 2024-01-22 — End: 2024-04-21

## 2024-01-22 NOTE — Telephone Encounter (Signed)
 Copied from CRM [id number]. Topic: Clinical - Medication Refill >> Jan 22, 2024 10:32 AM Earnestine Goes B wrote: Medication:  ALPRAZolam  (XANAX ) 0.5 MG tablet    Has the patient contacted their pharmacy? Yes (Agent: If no, request that the patient contact the pharmacy for the refill. If patient does not wish to contact the pharmacy document the reason why and proceed with request.) (Agent: If yes, when and what did the pharmacy advise?)  This is the patient's preferred pharmacy:  [id number] - [place], [place] - [address] AT [facility] [address] [place] [address] Phone: [phone] Fax: [phone]  Is this the correct pharmacy for this prescription? Yes If no, delete pharmacy and type the correct one.   Has the prescription been filled recently? Yes  Is the patient out of the medication? Yes  Has the patient been seen for an appointment in the last year OR does the patient have an upcoming appointment? Yes  Can we respond through MyChart? Yes  Agent: Please be advised that Rx refills may take up to 3 business days. We ask that you follow-up with your pharmacy.

## 2024-01-29 ENCOUNTER — Other Ambulatory Visit: Payer: Self-pay | Admitting: Family Medicine

## 2024-01-29 DIAGNOSIS — I10 Essential (primary) hypertension: Secondary | ICD-10-CM

## 2024-04-18 ENCOUNTER — Telehealth: Payer: Self-pay | Admitting: Family Medicine

## 2024-04-18 NOTE — Telephone Encounter (Signed)
 Copied from CRM [id number]. Topic: Appointments - Appointment Scheduling >> Apr 18, 2024  4:36 PM [name] wrote: Patient/patient representative is calling to schedule an appointment. Refer to attachments for appointment information.   Monday May 02, 2024 Arrive by 10:25 AMAppt at 10:40 AM (40 min)  Patients daughter requesting med check - states BP is still running high in 130's off and on with both BP meds.  Scheduled soonest appointment but daughter [name] is wanting to know if there is any way that the patient can be worked in for this Thursday after 12:30p because she is off that day. Please advise # [phone]. If she does not answer please leave a detailed voicemail will likely be working when you reach out.

## 2024-04-18 NOTE — Telephone Encounter (Signed)
 I scheduled her for this coming Thursday at 3pm.  Please call daughter and let her know.  Per notes ok to LM for her as well   Scheduled soonest appointment but daughter [name] is wanting to know if there is any way that the patient can be worked in for this Thursday after 12:30p because she is off that day. Please advise # [phone]. If she does not answer please leave a detailed voicemail will likely be working when you reach out.

## 2024-04-19 NOTE — Telephone Encounter (Signed)
 I called and confirmed with daughter

## 2024-04-19 NOTE — Progress Notes (Unsigned)
 [facility] at [facility] [address] [place], [place] [address] [phone]  Date:  04/21/2024   Name:  Amber Nichols   DOB:  1956/05/12   MRN:  [id number]  PCP:  Watt Harlene BROCKS, MD    Chief Complaint: No chief complaint on file.   History of Present Illness:  Amber Nichols is a 68 y.o. very pleasant female patient who presents with the following:  Patient seen today for a follow-up visit.  I saw her most recently about 1 year ago  History of hypertension, pre-diabetes, anxiety/ depression- she has cardiology care     Seen today with her daughter who assists with interpretation Can update labs today  Mammogram Recommend shingles vaccine Recommend tetanus Colonoscopy up-to-date Due for bone density update Can do pap once more if she would like  Patient Active Problem List   Diagnosis Date Noted   Prediabetes 07/29/2021   Precordial chest pain 10/03/2020   Dyspnea on exertion 10/03/2020   Night terror    Hypertension    COVID-19 virus infection 2020    Past Medical History:  Diagnosis Date   COVID-19 virus infection 2020   Hypertension    Night terror     Past Surgical History:  Procedure Laterality Date   COLONOSCOPY WITH PROPOFOL  N/A 02/10/2020   Procedure: COLONOSCOPY WITH PROPOFOL ;  Surgeon: Therisa Bi, MD;  Location: [facility] ENDOSCOPY;  Service: Gastroenterology;  Laterality: N/A;  C-19 POSITIVE ON 01/13/20   DILATION AND CURETTAGE OF UTERUS     ESOPHAGOGASTRODUODENOSCOPY (EGD) WITH PROPOFOL  N/A 02/10/2020   Procedure: ESOPHAGOGASTRODUODENOSCOPY (EGD) WITH PROPOFOL ;  Surgeon: Therisa Bi, MD;  Location: [facility] ENDOSCOPY;  Service: Gastroenterology;  Laterality: N/A;   HEMORRHOID SURGERY      Social History   Tobacco Use   Smoking status: Never   Smokeless tobacco: Never  Vaping Use   Vaping status: Never Used  Substance Use Topics   Alcohol use: Never   Drug use: Never    Family History   Problem Relation Age of Onset   Breast cancer Sister    Colon cancer Neg Hx    Esophageal cancer Neg Hx     No Known Allergies  Medication list has been reviewed and updated.  Current Outpatient Medications on File Prior to Visit  Medication Sig Dispense Refill   ALPRAZolam  (XANAX ) 0.5 MG tablet TAKE 1/2 TABLET(0.25 MG) BY MOUTH AT BEDTIME AS NEEDED FOR ANXIETY.  Time for office visit please 30 tablet 0   atorvastatin  (LIPITOR) 10 MG tablet TAKE 1 TABLET(10 MG) BY MOUTH DAILY 90 tablet 0   FLUoxetine  (PROZAC ) 20 MG capsule Take 1 capsule (20 mg total) by mouth daily. 90 capsule 1   gabapentin  (NEURONTIN ) 100 MG capsule TAKE 1 CAPSULE(100 MG) BY MOUTH AT BEDTIME 90 capsule 1   meloxicam  (MOBIC ) 15 MG tablet Take 1 tablet (15 mg total) by mouth daily. 30 tablet 0   meloxicam  (MOBIC ) 7.5 MG tablet Take 1 tablet (7.5 mg total) by mouth 2 (two) times daily as needed for pain. 30 tablet 2   metoprolol  succinate (TOPROL -XL) 50 MG 24 hr tablet TAKE 1 TABLET BY MOUTH DAILY TWICE DAILY IF BLOOD PRESSURE IS ELEVATED. TAKE WITH OR IMMEDIATELY FOLLOWING A MEAL 145 tablet 3   predniSONE  (STERAPRED UNI-PAK 21 TAB) 10 MG (21) TBPK tablet Take as directed 21 tablet 3   No current facility-administered medications on file prior to visit.    Review of Systems:  As per HPI- otherwise negative.   Physical Examination: There were no vitals filed for this visit. There were no vitals filed for this visit. There is no height or weight on file to calculate BMI. Ideal Body Weight:    GEN: no acute distress. HEENT: Atraumatic, Normocephalic.  Ears and Nose: No external deformity. CV: RRR, No M/G/R. No JVD. No thrill. No extra heart sounds. PULM: CTA B, no wheezes, crackles, rhonchi. No retractions. No resp. distress. No accessory muscle use. ABD: S, NT, ND, +BS. No rebound. No HSM. EXTR: No c/c/e PSYCH: Normally interactive. Conversant.    Assessment and Plan: ***  Signed Harlene Schroeder, MD

## 2024-04-21 ENCOUNTER — Encounter: Payer: Self-pay | Admitting: Family Medicine

## 2024-04-21 ENCOUNTER — Ambulatory Visit: Admitting: Family Medicine

## 2024-04-21 VITALS — BP 124/82 | HR 79 | Ht 64.0 in | Wt 170.6 lb

## 2024-04-21 DIAGNOSIS — R7303 Prediabetes: Secondary | ICD-10-CM | POA: Diagnosis not present

## 2024-04-21 DIAGNOSIS — R5383 Other fatigue: Secondary | ICD-10-CM | POA: Diagnosis not present

## 2024-04-21 DIAGNOSIS — E785 Hyperlipidemia, unspecified: Secondary | ICD-10-CM | POA: Diagnosis not present

## 2024-04-21 DIAGNOSIS — I10 Essential (primary) hypertension: Secondary | ICD-10-CM

## 2024-04-21 DIAGNOSIS — F514 Sleep terrors [night terrors]: Secondary | ICD-10-CM

## 2024-04-21 DIAGNOSIS — D539 Nutritional anemia, unspecified: Secondary | ICD-10-CM

## 2024-04-21 DIAGNOSIS — M2550 Pain in unspecified joint: Secondary | ICD-10-CM

## 2024-04-21 MED ORDER — ATORVASTATIN CALCIUM 10 MG PO TABS: ORAL_TABLET | ORAL | 0 refills | Status: DC

## 2024-04-21 MED ORDER — MELOXICAM 15 MG PO TABS: 15.0000 mg | ORAL_TABLET | Freq: Every day | ORAL | 1 refills | Status: DC

## 2024-04-21 MED ORDER — ALPRAZOLAM 0.5 MG PO TABS: ORAL_TABLET | ORAL | 0 refills | Status: AC

## 2024-04-21 MED ORDER — VENLAFAXINE HCL ER 37.5 MG PO CP24: 37.5000 mg | ORAL_CAPSULE | Freq: Every day | ORAL | 3 refills | Status: DC

## 2024-04-21 MED ORDER — LISINOPRIL 10 MG PO TABS: 10.0000 mg | ORAL_TABLET | Freq: Every day | ORAL | 3 refills | Status: AC

## 2024-04-21 NOTE — Patient Instructions (Addendum)
 It was good to see you today I will be in touch with her labs as soon as possible and we can go over the results together We will try venlefaxine 37.5 mg once daily for anxiety and depression -let me know how this works for you. This is the lowest dose, we can go up if needed  For blood pressure, lets stick with metoprolol  50 mg.  I am afraid 100 mg may cause her too much fatigue.  However, if you find her blood pressure is still elevated higher than about 140/90 please add the prescription for lisinopril  10 mg  Okay to use meloxicam  once daily as needed for joint and body aches   Okay to take half tablet of alprazolam  twice daily as needed for anxiety  I would recommend getting the shingles vaccine series at your pharmacy as well as a tetanus shot booster at your convenience

## 2024-04-22 ENCOUNTER — Encounter: Payer: Self-pay | Admitting: Family Medicine

## 2024-04-22 LAB — COMPREHENSIVE METABOLIC PANEL WITH GFR
ALT: 14 U/L (ref 0–35)
AST: 16 U/L (ref 0–37)
Albumin: 4.3 g/dL (ref 3.5–5.2)
Alkaline Phosphatase: 66 U/L (ref 39–117)
BUN: 16 mg/dL (ref 6–23)
CO2: 31 meq/L (ref 19–32)
Calcium: 9.2 mg/dL (ref 8.4–10.5)
Chloride: 102 meq/L (ref 96–112)
Creatinine, Ser: 0.77 mg/dL (ref 0.40–1.20)
GFR: 80.13 mL/min (ref >60.00)
Glucose, Bld: 95 mg/dL (ref 70–99)
Potassium: 4.1 meq/L (ref 3.5–5.1)
Sodium: 143 meq/L (ref 135–145)
Total Bilirubin: 0.3 mg/dL (ref 0.2–1.2)
Total Protein: 6.9 g/dL (ref 6.0–8.3)

## 2024-04-22 LAB — CBC
HCT: 38.6 % (ref 36.0–46.0)
Hemoglobin: 12.7 g/dL (ref 12.0–15.0)
MCHC: 33 g/dL (ref 30.0–36.0)
MCV: 90 fl (ref 78.0–100.0)
Platelets: 239 K/uL (ref 150.0–400.0)
RBC: 4.28 Mil/uL (ref 3.87–5.11)
RDW: 13.3 % (ref 11.5–15.5)
WBC: 5.8 K/uL (ref 4.0–10.5)

## 2024-04-22 LAB — LIPID PANEL
Cholesterol: 139 mg/dL (ref 0–200)
HDL: 50.1 mg/dL (ref >39.00)
LDL Cholesterol: 62 mg/dL (ref 0–99)
NonHDL: 89.14
Total CHOL/HDL Ratio: 3
Triglycerides: 134 mg/dL (ref 0.0–149.0)
VLDL: 26.8 mg/dL (ref 0.0–40.0)

## 2024-04-22 LAB — FERRITIN: Ferritin: 31.9 ng/mL (ref 10.0–291.0)

## 2024-04-22 LAB — HEMOGLOBIN A1C: Hgb A1c MFr Bld: 6.2 % (ref 4.6–6.5)

## 2024-04-22 LAB — VITAMIN B12: Vitamin B-12: 271 pg/mL (ref 211–911)

## 2024-04-22 LAB — TSH: TSH: 0.9 u[IU]/mL (ref 0.35–5.50)

## 2024-04-29 ENCOUNTER — Ambulatory Visit: Payer: Self-pay | Admitting: Family Medicine

## 2024-04-29 LAB — 25-HYDROXY VITAMIN D LCMS D2+D3
25-Hydroxy, Vitamin D-2: <1 ng/mL
25-Hydroxy, Vitamin D-3: 55 ng/mL
25-Hydroxy, Vitamin D: 55 ng/mL

## 2024-04-29 NOTE — Progress Notes (Deleted)
 [facility] at [facility] [address], [address] [place], [place] [address] [phone]  Date:  05/02/2024   Name:  Amber Nichols   DOB:  1955/11/14   MRN:  [id number]  PCP:  Watt Harlene BROCKS, MD    Chief Complaint: No chief complaint on file.   History of Present Illness:  Amber Nichols is a 69 y.o. very pleasant female patient who presents with the following:  Patient seen today for follow-up of blood pressure.  I saw her recently on August 7 with concern of elevated blood pressure.  Patient also has history of prediabetes, anxiety and depression.  She does have a cardiologist Notes from previous visit: They have noted her BP running a bit too high at home over the last 2 weeks.  However she has been under quite a bit of stress, she is worried about unrest in the [place] and other concerns. SBP around 150 and pulse in the 50s-60 at home.  They check it at home regularly She started taking 2 of her metoprolol - 100 mg total- over the last few days in hopes of improving her numbers She actually just took 50 mg today  Patient notes over the last couple of days she has started to feel better, her blood pressure looks good today We will try venlefaxine 37.5 mg once daily for anxiety and depression -let me know how this works for you. This is the lowest dose, we can go up if needed For blood pressure, lets stick with metoprolol  50 mg.  I am afraid 100 mg may cause her too much fatigue.  However, if you find her blood pressure is still elevated higher than about 140/90 please add the prescription for lisinopril  10 mg Okay to use meloxicam  once daily as needed for joint and body aches Okay to take half tablet of alprazolam  twice daily as needed for anxiety   BP Readings from Last 3 Encounters:  04/21/24 124/82  04/20/23 112/72  08/18/22 122/70    Patient Active Problem List   Diagnosis Date Noted   Prediabetes 07/29/2021    Precordial chest pain 10/03/2020   Dyspnea on exertion 10/03/2020   Night terror    Hypertension     Past Medical History:  Diagnosis Date   COVID-19 virus infection 2020   Hypertension    Night terror     Past Surgical History:  Procedure Laterality Date   COLONOSCOPY WITH PROPOFOL  N/A 02/10/2020   Procedure: COLONOSCOPY WITH PROPOFOL ;  Surgeon: Therisa Bi, MD;  Location: [facility] ENDOSCOPY;  Service: Gastroenterology;  Laterality: N/A;  C-19 POSITIVE ON 01/13/20   DILATION AND CURETTAGE OF UTERUS     ESOPHAGOGASTRODUODENOSCOPY (EGD) WITH PROPOFOL  N/A 02/10/2020   Procedure: ESOPHAGOGASTRODUODENOSCOPY (EGD) WITH PROPOFOL ;  Surgeon: Therisa Bi, MD;  Location: [facility] ENDOSCOPY;  Service: Gastroenterology;  Laterality: N/A;   HEMORRHOID SURGERY      Social History   Tobacco Use   Smoking status: Never   Smokeless tobacco: Never  Vaping Use   Vaping status: Never Used  Substance Use Topics   Alcohol use: Never   Drug use: Never    Family History  Problem Relation Age of Onset   Breast cancer Sister    Colon cancer Neg Hx    Esophageal cancer Neg Hx     No Known Allergies  Medication list has been reviewed and updated.  Current Outpatient Medications on File Prior to Visit  Medication Sig Dispense Refill  ALPRAZolam  (XANAX ) 0.5 MG tablet TAKE 1/2 TABLET(0.25 MG) BY TWICE DAILY AS NEEDED FOR ANXIETY. 90 tablet 0   atorvastatin  (LIPITOR) 10 MG tablet TAKE 1 TABLET(10 MG) BY MOUTH DAILY 90 tablet 0   gabapentin  (NEURONTIN ) 100 MG capsule TAKE 1 CAPSULE(100 MG) BY MOUTH AT BEDTIME 90 capsule 1   lisinopril  (ZESTRIL ) 10 MG tablet Take 1 tablet (10 mg total) by mouth daily. 90 tablet 3   meloxicam  (MOBIC ) 15 MG tablet Take 1 tablet (15 mg total) by mouth daily. 90 tablet 1   meloxicam  (MOBIC ) 7.5 MG tablet Take 1 tablet (7.5 mg total) by mouth 2 (two) times daily as needed for pain. 30 tablet 2   metoprolol  succinate (TOPROL -XL) 50 MG 24 hr tablet TAKE 1 TABLET BY MOUTH DAILY  TWICE DAILY IF BLOOD PRESSURE IS ELEVATED. TAKE WITH OR IMMEDIATELY FOLLOWING A MEAL 145 tablet 3   venlafaxine  XR (EFFEXOR  XR) 37.5 MG 24 hr capsule Take 1 capsule (37.5 mg total) by mouth daily with breakfast. Use daily for anxiety 90 capsule 3   No current facility-administered medications on file prior to visit.    Review of Systems:  As per HPI- otherwise negative.   Physical Examination: There were no vitals filed for this visit. There were no vitals filed for this visit. There is no height or weight on file to calculate BMI. Ideal Body Weight:    GEN: no acute distress. HEENT: Atraumatic, Normocephalic.  Ears and Nose: No external deformity. CV: RRR, No M/G/R. No JVD. No thrill. No extra heart sounds. PULM: CTA B, no wheezes, crackles, rhonchi. No retractions. No resp. distress. No accessory muscle use. ABD: S, NT, ND, +BS. No rebound. No HSM. EXTR: No c/c/e PSYCH: Normally interactive. Conversant.    Assessment and Plan: ***  Signed Harlene Schroeder, MD

## 2024-05-02 ENCOUNTER — Ambulatory Visit: Admitting: Family Medicine

## 2024-06-03 ENCOUNTER — Other Ambulatory Visit: Payer: Self-pay | Admitting: Family

## 2024-06-13 ENCOUNTER — Other Ambulatory Visit: Payer: Self-pay | Admitting: Family Medicine

## 2024-06-13 NOTE — Telephone Encounter (Signed)
 Copied from CRM #[id number]. Topic: Clinical - Medication Refill >> Jun 13, 2024 11:34 AM Rea ORN wrote: Medication:  gabapentin  (NEURONTIN ) 100 MG capsule   Has the patient contacted their pharmacy? Yes (Agent: If no, request that the patient contact the pharmacy for the refill. If patient does not wish to contact the pharmacy document the reason why and proceed with request.) (Agent: If yes, when and what did the pharmacy advise?)  This is the patient's preferred pharmacy:  [facility] #[id number] - [place], [place] - [address] AT [address] [place] [address] Phone: [phone] Fax: [phone]  Is this the correct pharmacy for this prescription? Yes If no, delete pharmacy and type the correct one.   Has the prescription been filled recently? No  Is the patient out of the medication? No  Has the patient been seen for an appointment in the last year OR does the patient have an upcoming appointment? Yes  Can we respond through MyChart? No  Agent: Please be advised that Rx refills may take up to 3 business days. We ask that you follow-up with your pharmacy.

## 2024-06-13 NOTE — Telephone Encounter (Signed)
 Rx prescribed by Rocky Hans at [place]. Error CRM sent.

## 2024-06-14 ENCOUNTER — Telehealth: Payer: Self-pay | Admitting: Family Medicine

## 2024-06-14 NOTE — Telephone Encounter (Signed)
 Copied from CRM #[id number]. Topic: General - Other >> Jun 14, 2024  1:51 PM Mesmerise C wrote: Reason for CRM: Patient's daughter Ronita needing medical certification  paperwork filled out the for [place]  citizenship it's missing diagnosis code and needing it to be filled out so it won't be denied

## 2024-06-14 NOTE — Telephone Encounter (Signed)
 Pts daughter states today she had an appointment for pts citizenship, Ronita was advised the forms were filled out correctly however the forms is missing a few diagnosis codes pt is requesting we call her back to let her know which codes to write down.

## 2024-06-15 NOTE — Telephone Encounter (Signed)
 Called daughter Ronita back-no answer, left message on machine.  I am not quite sure what diagnosis codes they need for how they need her mother's paperwork corrected.  Could you please bring the paperwork by my office and show me where the problem lies?  Thank you

## 2024-07-18 ENCOUNTER — Encounter: Payer: Self-pay | Admitting: Radiology

## 2024-07-26 NOTE — Progress Notes (Unsigned)
 [facility] at [facility] [address] [place], [place] [address] [phone]  Date:  07/28/2024   Name:  Amber Nichols   DOB:  [birth year]   MRN:  [id number]  PCP:  Watt Harlene BROCKS, MD    Chief Complaint: No chief complaint on file.   History of Present Illness:  Amber Nichols is a 68 y.o. very pleasant female patient who presents with the following:  Patient seen today for periodic follow-up.  I saw her most recently in August Accompanied today by her husband and daughter, her daughter provides interpretation services per patient's request History of hypertension, pre-diabetes, anxiety/ depression- she has cardiology care    At her last visit in August she has stopped using fluoxetine  due to side effects, I had her try venlafaxine  instead She has used alprazolam  as needed  -Recommend flu shot - Mammogram - Recommend COVID booster this fall - Recommend tetanus - Recommend shingles vaccine Colonoscopy is up-to-date, she has declined further Pap smear screening We did complete labs for her in August-A1c 6.2%  Discussed the use of AI scribe software for clinical note transcription with the patient, who gave verbal consent to proceed.  History of Present Illness    Patient Active Problem List   Diagnosis Date Noted   Prediabetes 07/29/2021   Precordial chest pain 10/03/2020   Dyspnea on exertion 10/03/2020   Night terror    Hypertension     Past Medical History:  Diagnosis Date   COVID-19 virus infection 2020   Hypertension    Night terror     Past Surgical History:  Procedure Laterality Date   COLONOSCOPY WITH PROPOFOL  N/A 02/10/2020   Procedure: COLONOSCOPY WITH PROPOFOL ;  Surgeon: Therisa Bi, MD;  Location: [facility] ENDOSCOPY;  Service: Gastroenterology;  Laterality: N/A;  C-19 POSITIVE ON 01/13/20   DILATION AND CURETTAGE OF UTERUS     ESOPHAGOGASTRODUODENOSCOPY (EGD) WITH PROPOFOL  N/A 02/10/2020    Procedure: ESOPHAGOGASTRODUODENOSCOPY (EGD) WITH PROPOFOL ;  Surgeon: Therisa Bi, MD;  Location: [facility] ENDOSCOPY;  Service: Gastroenterology;  Laterality: N/A;   HEMORRHOID SURGERY      Social History   Tobacco Use   Smoking status: Never   Smokeless tobacco: Never  Vaping Use   Vaping status: Never Used  Substance Use Topics   Alcohol use: Never   Drug use: Never    Family History  Problem Relation Age of Onset   Breast cancer Sister    Colon cancer Neg Hx    Esophageal cancer Neg Hx     No Known Allergies  Medication list has been reviewed and updated.  Current Outpatient Medications on File Prior to Visit  Medication Sig Dispense Refill   ALPRAZolam  (XANAX ) 0.5 MG tablet TAKE 1/2 TABLET(0.25 MG) BY TWICE DAILY AS NEEDED FOR ANXIETY. 90 tablet 0   atorvastatin  (LIPITOR) 10 MG tablet TAKE 1 TABLET(10 MG) BY MOUTH DAILY 90 tablet 0   gabapentin  (NEURONTIN ) 100 MG capsule TAKE 1 CAPSULE(100 MG) BY MOUTH AT BEDTIME 90 capsule 1   lisinopril  (ZESTRIL ) 10 MG tablet Take 1 tablet (10 mg total) by mouth daily. 90 tablet 3   meloxicam  (MOBIC ) 15 MG tablet Take 1 tablet (15 mg total) by mouth daily. 90 tablet 1   meloxicam  (MOBIC ) 7.5 MG tablet Take 1 tablet (7.5 mg total) by mouth 2 (two) times daily as needed for pain. 30 tablet 2   metoprolol  succinate (TOPROL -XL) 50 MG 24 hr tablet TAKE 1 TABLET BY  MOUTH DAILY TWICE DAILY IF BLOOD PRESSURE IS ELEVATED. TAKE WITH OR IMMEDIATELY FOLLOWING A MEAL 145 tablet 3   venlafaxine  XR (EFFEXOR  XR) 37.5 MG 24 hr capsule Take 1 capsule (37.5 mg total) by mouth daily with breakfast. Use daily for anxiety 90 capsule 3   No current facility-administered medications on file prior to visit.    Review of Systems:  As per HPI- otherwise negative.   Physical Examination: There were no vitals filed for this visit. There were no vitals filed for this visit. There is no height or weight on file to calculate BMI. Ideal Body Weight:    GEN: no  acute distress. HEENT: Atraumatic, Normocephalic.  Ears and Nose: No external deformity. CV: RRR, No M/G/R. No JVD. No thrill. No extra heart sounds. PULM: CTA B, no wheezes, crackles, rhonchi. No retractions. No resp. distress. No accessory muscle use. ABD: S, NT, ND, +BS. No rebound. No HSM. EXTR: No c/c/e PSYCH: Normally interactive. Conversant.    Assessment and Plan: No diagnosis found.  Assessment & Plan   Signed Harlene Schroeder, MD

## 2024-07-28 ENCOUNTER — Ambulatory Visit (INDEPENDENT_AMBULATORY_CARE_PROVIDER_SITE_OTHER): Admitting: Family Medicine

## 2024-07-28 VITALS — BP 132/84 | HR 88 | Temp 98.1°F | Ht 64.0 in | Wt 167.8 lb

## 2024-07-28 DIAGNOSIS — H539 Unspecified visual disturbance: Secondary | ICD-10-CM | POA: Diagnosis not present

## 2024-07-28 DIAGNOSIS — Z0000 Encounter for general adult medical examination without abnormal findings: Secondary | ICD-10-CM

## 2024-07-28 DIAGNOSIS — H9193 Unspecified hearing loss, bilateral: Secondary | ICD-10-CM | POA: Diagnosis not present

## 2024-07-28 DIAGNOSIS — E785 Hyperlipidemia, unspecified: Secondary | ICD-10-CM

## 2024-07-28 DIAGNOSIS — Z23 Encounter for immunization: Secondary | ICD-10-CM | POA: Diagnosis not present

## 2024-07-28 DIAGNOSIS — R350 Frequency of micturition: Secondary | ICD-10-CM | POA: Diagnosis not present

## 2024-07-28 DIAGNOSIS — R7303 Prediabetes: Secondary | ICD-10-CM | POA: Diagnosis not present

## 2024-07-28 DIAGNOSIS — Z1231 Encounter for screening mammogram for malignant neoplasm of breast: Secondary | ICD-10-CM

## 2024-07-28 DIAGNOSIS — M2550 Pain in unspecified joint: Secondary | ICD-10-CM

## 2024-07-28 MED ORDER — ATORVASTATIN CALCIUM 10 MG PO TABS: ORAL_TABLET | ORAL | 3 refills | Status: AC

## 2024-07-28 MED ORDER — MELOXICAM 15 MG PO TABS: 15.0000 mg | ORAL_TABLET | Freq: Every day | ORAL | 1 refills | Status: AC

## 2024-07-28 NOTE — Patient Instructions (Signed)
 Good to see you today Flu shot today Recommend at your pharmacy- covid booster, tetanus, shingles vaccine series if not done already

## 2024-07-29 ENCOUNTER — Encounter: Payer: Self-pay | Admitting: Family Medicine

## 2024-07-29 LAB — BASIC METABOLIC PANEL WITH GFR
BUN: 18 mg/dL (ref 6–23)
CO2: 32 meq/L (ref 19–32)
Calcium: 9.4 mg/dL (ref 8.4–10.5)
Chloride: 101 meq/L (ref 96–112)
Creatinine, Ser: 0.86 mg/dL (ref 0.40–1.20)
GFR: 70.04 mL/min (ref >60.00)
Glucose, Bld: 83 mg/dL (ref 70–99)
Potassium: 4.3 meq/L (ref 3.5–5.1)
Sodium: 141 meq/L (ref 135–145)

## 2024-07-29 LAB — URINE CULTURE
MICRO NUMBER:: 17231255
Result:: NO GROWTH
SPECIMEN QUALITY:: ADEQUATE

## 2024-07-29 LAB — HEMOGLOBIN A1C: Hgb A1c MFr Bld: 6 % (ref 4.6–6.5)

## 2024-07-30 ENCOUNTER — Ambulatory Visit: Payer: Self-pay | Admitting: Family Medicine

## 2024-09-16 ENCOUNTER — Encounter: Payer: Self-pay | Admitting: Family Medicine

## 2024-10-21 ENCOUNTER — Other Ambulatory Visit: Payer: Self-pay | Admitting: Family Medicine

## 2024-10-21 DIAGNOSIS — F514 Sleep terrors [night terrors]: Secondary | ICD-10-CM
